# Patient Record
Sex: Female | Born: 1945
Health system: Southern US, Community
[De-identification: ages and names within clinical notes are randomized; demographics above are authoritative.]

## PROBLEM LIST (undated history)

## (undated) DIAGNOSIS — E039 Hypothyroidism, unspecified: Secondary | ICD-10-CM

## (undated) DIAGNOSIS — E119 Type 2 diabetes mellitus without complications: Secondary | ICD-10-CM

## (undated) DIAGNOSIS — I890 Lymphedema, not elsewhere classified: Secondary | ICD-10-CM

## (undated) DIAGNOSIS — I10 Essential (primary) hypertension: Secondary | ICD-10-CM

## (undated) DIAGNOSIS — Z9221 Personal history of antineoplastic chemotherapy: Secondary | ICD-10-CM

## (undated) DIAGNOSIS — Z923 Personal history of irradiation: Secondary | ICD-10-CM

## (undated) DIAGNOSIS — C50919 Malignant neoplasm of unspecified site of unspecified female breast: Secondary | ICD-10-CM

## (undated) HISTORY — PX: BREAST BIOPSY: SHX20

## (undated) HISTORY — PX: APPENDECTOMY: SHX54

## (undated) HISTORY — PX: TUBAL LIGATION: SHX77

---

## 1998-03-25 DIAGNOSIS — C50919 Malignant neoplasm of unspecified site of unspecified female breast: Secondary | ICD-10-CM

## 1998-03-25 HISTORY — PX: BREAST LUMPECTOMY: SHX2

## 1998-03-25 HISTORY — DX: Malignant neoplasm of unspecified site of unspecified female breast: C50.919

## 1998-03-25 HISTORY — PX: BREAST EXCISIONAL BIOPSY: SUR124

## 2004-03-15 ENCOUNTER — Ambulatory Visit: Payer: Self-pay | Admitting: Unknown Physician Specialty

## 2005-04-30 ENCOUNTER — Ambulatory Visit: Payer: Self-pay | Admitting: Unknown Physician Specialty

## 2005-10-11 ENCOUNTER — Ambulatory Visit: Payer: Self-pay | Admitting: Family Medicine

## 2005-11-28 ENCOUNTER — Ambulatory Visit: Payer: Self-pay | Admitting: Family Medicine

## 2006-05-10 ENCOUNTER — Emergency Department: Payer: Self-pay | Admitting: Emergency Medicine

## 2006-05-12 ENCOUNTER — Emergency Department: Payer: Self-pay | Admitting: Emergency Medicine

## 2006-06-10 ENCOUNTER — Ambulatory Visit: Payer: Self-pay | Admitting: Unknown Physician Specialty

## 2007-02-21 ENCOUNTER — Emergency Department: Payer: Self-pay | Admitting: Emergency Medicine

## 2007-07-22 ENCOUNTER — Ambulatory Visit: Payer: Self-pay | Admitting: Unknown Physician Specialty

## 2008-09-06 ENCOUNTER — Ambulatory Visit: Payer: Self-pay | Admitting: Family Medicine

## 2009-10-23 ENCOUNTER — Ambulatory Visit: Payer: Self-pay | Admitting: Family Medicine

## 2010-11-02 ENCOUNTER — Ambulatory Visit: Payer: Self-pay | Admitting: Family Medicine

## 2011-11-12 ENCOUNTER — Ambulatory Visit: Payer: Self-pay | Admitting: Nurse Practitioner

## 2012-11-11 ENCOUNTER — Ambulatory Visit: Payer: Self-pay | Admitting: Nurse Practitioner

## 2013-11-30 ENCOUNTER — Ambulatory Visit: Payer: Self-pay | Admitting: Nurse Practitioner

## 2014-04-22 ENCOUNTER — Ambulatory Visit: Payer: Self-pay | Admitting: Nurse Practitioner

## 2014-04-22 DIAGNOSIS — E119 Type 2 diabetes mellitus without complications: Secondary | ICD-10-CM | POA: Diagnosis not present

## 2014-04-22 DIAGNOSIS — R1031 Right lower quadrant pain: Secondary | ICD-10-CM | POA: Diagnosis not present

## 2014-04-22 DIAGNOSIS — I10 Essential (primary) hypertension: Secondary | ICD-10-CM | POA: Diagnosis not present

## 2014-04-22 DIAGNOSIS — K3580 Unspecified acute appendicitis: Secondary | ICD-10-CM | POA: Diagnosis not present

## 2014-04-22 LAB — COMPREHENSIVE METABOLIC PANEL
ALBUMIN: 3.1 g/dL — AB (ref 3.4–5.0)
ALK PHOS: 66 U/L (ref 46–116)
ALT: 23 U/L (ref 14–63)
ALT: 17 U/L (ref 14–63)
ANION GAP: 7 (ref 7–16)
AST: 20 U/L (ref 15–37)
Albumin: 3.4 g/dL (ref 3.4–5.0)
Alkaline Phosphatase: 68 U/L (ref 46–116)
Anion Gap: 7 (ref 7–16)
BUN: 9 mg/dL (ref 7–18)
BUN: 8 mg/dL (ref 7–18)
Bilirubin,Total: 1.4 mg/dL — ABNORMAL HIGH (ref 0.2–1.0)
Bilirubin,Total: 1.4 mg/dL — ABNORMAL HIGH (ref 0.2–1.0)
CHLORIDE: 96 mmol/L — AB (ref 98–107)
CO2: 30 mmol/L (ref 21–32)
CO2: 26 mmol/L (ref 21–32)
CREATININE: 0.8 mg/dL (ref 0.60–1.30)
Calcium, Total: 8.7 mg/dL (ref 8.5–10.1)
Calcium, Total: 8.3 mg/dL — ABNORMAL LOW (ref 8.5–10.1)
Chloride: 98 mmol/L (ref 98–107)
Creatinine: 1 mg/dL (ref 0.60–1.30)
EGFR (African American): >60
GFR CALC NON AF AMER: 59 — AB
Glucose: 128 mg/dL — ABNORMAL HIGH (ref 65–99)
Glucose: 161 mg/dL — ABNORMAL HIGH (ref 65–99)
Osmolality: 267 (ref 275–301)
Osmolality: 264 (ref 275–301)
POTASSIUM: 3.5 mmol/L (ref 3.5–5.1)
Potassium: 2.9 mmol/L — ABNORMAL LOW (ref 3.5–5.1)
SGOT(AST): 24 U/L (ref 15–37)
SODIUM: 131 mmol/L — AB (ref 136–145)
Sodium: 133 mmol/L — ABNORMAL LOW (ref 136–145)
Total Protein: 7.5 g/dL (ref 6.4–8.2)
Total Protein: 6.9 g/dL (ref 6.4–8.2)

## 2014-04-22 LAB — URINALYSIS, COMPLETE
BACTERIA: NONE SEEN
BILIRUBIN, UR: NEGATIVE
GLUCOSE, UR: NEGATIVE mg/dL (ref 0–75)
KETONE: NEGATIVE
Nitrite: NEGATIVE
Ph: 6 (ref 4.5–8.0)
Protein: 30
RBC,UR: 5 /HPF (ref 0–5)
SPECIFIC GRAVITY: 1.005 (ref 1.003–1.030)

## 2014-04-22 LAB — CBC
HCT: 39.5 % (ref 35.0–47.0)
HGB: 13 g/dL (ref 12.0–16.0)
MCH: 28.5 pg (ref 26.0–34.0)
MCHC: 32.8 g/dL (ref 32.0–36.0)
MCV: 87 fL (ref 80–100)
Platelet: 250 10*3/uL (ref 150–440)
RBC: 4.55 10*6/uL (ref 3.80–5.20)
RDW: 13.5 % (ref 11.5–14.5)
WBC: 16.7 10*3/uL — ABNORMAL HIGH (ref 3.6–11.0)

## 2014-04-22 LAB — PROTIME-INR
INR: 1.3
PROTHROMBIN TIME: 15.9 s — AB (ref 11.5–14.7)

## 2014-04-22 LAB — APTT: Activated PTT: 27.3 secs (ref 23.6–35.9)

## 2014-04-22 LAB — LIPASE, BLOOD: Lipase: 63 U/L — ABNORMAL LOW (ref 73–393)

## 2014-04-23 ENCOUNTER — Inpatient Hospital Stay: Payer: Self-pay | Admitting: Surgery

## 2014-04-23 DIAGNOSIS — R1031 Right lower quadrant pain: Secondary | ICD-10-CM | POA: Diagnosis not present

## 2014-04-23 DIAGNOSIS — Z923 Personal history of irradiation: Secondary | ICD-10-CM | POA: Diagnosis not present

## 2014-04-23 DIAGNOSIS — K3580 Unspecified acute appendicitis: Secondary | ICD-10-CM | POA: Diagnosis not present

## 2014-04-23 DIAGNOSIS — Z882 Allergy status to sulfonamides status: Secondary | ICD-10-CM | POA: Diagnosis not present

## 2014-04-23 DIAGNOSIS — E119 Type 2 diabetes mellitus without complications: Secondary | ICD-10-CM | POA: Diagnosis not present

## 2014-04-23 DIAGNOSIS — K352 Acute appendicitis with generalized peritonitis: Secondary | ICD-10-CM | POA: Diagnosis not present

## 2014-04-23 DIAGNOSIS — Z9851 Tubal ligation status: Secondary | ICD-10-CM | POA: Diagnosis not present

## 2014-04-23 DIAGNOSIS — K353 Acute appendicitis with localized peritonitis: Secondary | ICD-10-CM | POA: Diagnosis not present

## 2014-04-23 DIAGNOSIS — I10 Essential (primary) hypertension: Secondary | ICD-10-CM | POA: Diagnosis not present

## 2014-04-23 DIAGNOSIS — Z853 Personal history of malignant neoplasm of breast: Secondary | ICD-10-CM | POA: Diagnosis not present

## 2014-04-23 DIAGNOSIS — Z09 Encounter for follow-up examination after completed treatment for conditions other than malignant neoplasm: Secondary | ICD-10-CM | POA: Diagnosis not present

## 2014-04-23 DIAGNOSIS — Z9221 Personal history of antineoplastic chemotherapy: Secondary | ICD-10-CM | POA: Diagnosis not present

## 2014-04-24 LAB — CBC WITH DIFFERENTIAL/PLATELET
Basophil #: 0 10*3/uL (ref 0.0–0.1)
Basophil %: 0.1 %
Eosinophil #: 0 10*3/uL (ref 0.0–0.7)
Eosinophil %: 0.1 %
HCT: 33.2 % — ABNORMAL LOW (ref 35.0–47.0)
HGB: 11.1 g/dL — ABNORMAL LOW (ref 12.0–16.0)
LYMPHS PCT: 6.8 %
Lymphocyte #: 1.1 10*3/uL (ref 1.0–3.6)
MCH: 29 pg (ref 26.0–34.0)
MCHC: 33.4 g/dL (ref 32.0–36.0)
MCV: 87 fL (ref 80–100)
MONOS PCT: 8.8 %
Monocyte #: 1.4 x10 3/mm — ABNORMAL HIGH (ref 0.2–0.9)
NEUTROS ABS: 13.4 10*3/uL — AB (ref 1.4–6.5)
Neutrophil %: 84.2 %
Platelet: 241 10*3/uL (ref 150–440)
RBC: 3.82 10*6/uL (ref 3.80–5.20)
RDW: 13.8 % (ref 11.5–14.5)
WBC: 15.9 10*3/uL — AB (ref 3.6–11.0)

## 2014-04-24 LAB — BASIC METABOLIC PANEL
Anion Gap: 6 — ABNORMAL LOW (ref 7–16)
BUN: 11 mg/dL (ref 7–18)
CREATININE: 0.76 mg/dL (ref 0.60–1.30)
Calcium, Total: 8.7 mg/dL (ref 8.5–10.1)
Chloride: 105 mmol/L (ref 98–107)
Co2: 29 mmol/L (ref 21–32)
EGFR (Non-African Amer.): >60
GLUCOSE: 139 mg/dL — AB (ref 65–99)
Osmolality: 281 (ref 275–301)
POTASSIUM: 3.4 mmol/L — AB (ref 3.5–5.1)
SODIUM: 140 mmol/L (ref 136–145)

## 2014-07-18 LAB — SURGICAL PATHOLOGY

## 2014-07-24 NOTE — Discharge Summary (Signed)
PATIENT NAME:  Carmen Hamilton, Carmen Hamilton MR#:  [id number] DATE OF BIRTH:  1946/01/14  DATE OF ADMISSION:  04/23/2014 DATE OF DISCHARGE:  04/25/2014  FINAL DIAGNOSIS: Acute perforated appendicitis.   PRINCIPLE PROCEDURES:  1. CT scan of the abdomen and pelvis.  2. Laparoscopic appendectomy.   HOSPITAL COURSE SUMMARY: The patient underwent a laparoscopic appendectomy. She was treated with intravenous antibiotics. On postoperative day #2, she was feeling well. By postoperative day #3, the patient was doing much better, was stable and improved for discharge, with follow-up in the office. She was tolerating a regular diet.   DISCHARGE MEDICATIONS: Will include: Augmentin 500 mg 1 tab every 12 hours for 1 more week. Medication reconciliation form was performed    ____________________________ Jeannette How. Marina Gravel, MD mab:mw D: 05/11/2014 12:43:25 ET T: 05/11/2014 13:25:17 ET JOB#: [id number]  cc: Elta Guadeloupe A. Marina Gravel, MD, <Dictator> Gerilynn Mccullars A Dequon Schnebly MD ELECTRONICALLY SIGNED 05/12/2014 10:06

## 2014-07-24 NOTE — H&P (Signed)
PATIENT NAME:  Carmen Hamilton, Carmen Hamilton MR#:  [id number] DATE OF BIRTH:  [birth year]  DATE OF ADMISSION:  04/22/2014  CHIEF COMPLAINT: Right lower quadrant pain.   HISTORY OF PRESENT ILLNESS: This is a patient with 1 day of abdominal pain that started diffusely and is now in the right lower quadrant. She denies any back pain. She has never had an episode like this before. She states that she had a fever to 100.7, earlier this morning, but no fever since. She has been nauseated but has not vomited. She denies melena or hematochezia, dysuria or hematemesis.   PAST MEDICAL HISTORY: Hypertension, diabetes for which she is not medicated and left breast cancer.   PAST SURGICAL HISTORY: Left breast conservation therapy with radiation and chemotherapy, 23 lymph nodes removed, 16 years ago; she is a long term survivor; she has also had a tubal ligation via semi-open technique periumbilically. No other abdominal surgeries. Of note, she does have some lymphedema of the left arm.   ALLERGIES: SULFA.   MEDICATIONS: See reconciliation.   FAMILY HISTORY: No history of appendicitis. No history of breast cancer.   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   REVIEW OF SYSTEMS: A complete system review is performed and negative with the exception of that mentioned in the HPI.   PHYSICAL EXAMINATION: GENERAL: Healthy, obese, female patient; she appears comfortable and in no acute distress.  VITAL SIGNS: No vital signs are available in the emergency yet.  HEENT: No scleral icterus.  NECK: No palpable neck nodes.  CHEST: Clear to auscultation.  CARDIAC: Regular rate and rhythm.  ABDOMEN: Showing a periumbilical infraumbilical scar without hernia. The abdomen is otherwise soft. There is minimal guarding in the right lower quadrant, minimal percussion tenderness in the right lower quadrant; tenderness to deep palpation in the right lower quadrant.  EXTREMITIES: Minimal edema of the  lower extremities, fairly marked edema of the left upper extremity, where an IV has been placed.   DIAGNOSTIC DATA:  CT scan shows appendicitis in a retrocecal position. Laboratory values are now available showing white blood cell count 16.7, an H and H of 13 and 39; and a platelet count of 250,000, potassium is 2.9, sodium of 133, chloride of 96, bilirubin 1.4, and a glucose of 128, lipase of 63.   ASSESSMENT AND PLAN: This is a patient with and physical examination findings consistent with a history of appendicitis. CT confirms and she has a leukocytosis. I have recommended admission to the hospital and laparoscopic appendectomy this evening. I informed her that it would be performed by Dr. Marina Gravel. I discussed the options, and the rationale, and the risks of bleeding, infection, negative laparoscopy, conversion to an open procedure, and bowel injury as well as the risks of untreated ruptured appendix. She understood and agreed to proceed. Her family was present as well.    ____________________________ Jerrol Banana. Burt Knack, MD rec:nt D: 04/22/2014 17:46:45 ET T: 04/22/2014 19:04:07 ET JOB#: [id number]  cc: Jerrol Banana. Burt Knack, MD, <Dictator> Florene Glen MD ELECTRONICALLY SIGNED 04/23/2014 14:41

## 2014-07-24 NOTE — H&P (Signed)
Subjective/Chief Complaint RLQ pain   History of Present Illness one day RLQ pain, no back pain, no prior episode had fever to 11.7 no dysuria no melena min nausea   Past History PMH DM, no meds, HTN, left breast cancer no CAD, CVA PSH tubal lig, left breast cons therapy   Past Medical Health Hypertension, Diabetes Mellitus   Past Med/Surgical Hx:  HTN:   Cancer, Breast:   Lymph Node Dissection:   ALLERGIES:  Sulfa: Unknown  HOME MEDICATIONS: Medication Instructions Status  Avandia 8 mg oral tablet 1  orally   Active  hydrochlorothiazide-triamterene 25 mg-37.5 mg oral tablet 1  orally once a day  Active   Family and Social History:  Family History Non-Contributory   Social History negative tobacco, negative ETOH, retired Public house manager of [employer]   Review of Systems:  Fever/Chills Yes   Cough No   Abdominal Pain Yes   Diarrhea No   Constipation No   Nausea/Vomiting No   SOB/DOE No   Chest Pain No   Dysuria No   Tolerating Diet Nauseated   Medications/Allergies Reviewed Medications/Allergies reviewed   Physical Exam:  GEN no acute distress, obese   HEENT pink conjunctivae   NECK supple   RESP normal resp effort  clear BS  no use of accessory muscles   CARD regular rate   ABD positive tenderness  no hernia  scar, tender RLQ, min perc tenderness, no CVA tenderness   LYMPH negative neck   EXTR positive edema   SKIN left arm lymphedema   PSYCH alert, A+O to time, place, person, good insight   Lab Results: Hepatic:  29-Jan-16 17:12   Albumin, Serum 3.4  Routine Chem:  29-Jan-16 17:12   Lipase  63 (Result(s) reported on 22 Apr 2014 at 05:37PM.)  Glucose, Serum  128  BUN 9  Creatinine (comp) 1.00  Sodium, Serum  133  Potassium, Serum  2.9  Chloride, Serum  96  CO2, Serum 30  Calcium (Total), Serum 8.7  Osmolality (calc) 267  eGFR (African American) >60  eGFR (Non-African American)  59 (eGFR values <63m/min/1.73 m2 may be an  indication of chronic kidney disease (CKD). Calculated eGFR, using the MRDR Study equation, is useful in  patients with stable renal function. The eGFR calculation will not be reliable in acutely ill patients when serum creatinine is changing rapidly. It is not useful in patients on dialysis. The eGFR calculation may not be applicable to patients at the low and high extremes of body sizes, pregnant women, and vegetarians.)  Anion Gap 7  Routine Coag:  29-Jan-16 17:12   Prothrombin  15.9  INR 1.3 (INR reference interval applies to patients on anticoagulant therapy. A single INR therapeutic range for coumarins is not optimal for all indications; however, the suggested range for most indications is 2.0 - 3.0. Exceptions to the INR Reference Range may include: Prosthetic heart valves, acute myocardial infarction, prevention of myocardial infarction, and combinations of aspirin and anticoagulant. The need for a higher or lower target INR must be assessed individually. Reference: The Pharmacology and Management of the Vitamin K  antagonists: the seventh ACCP Conference on Antithrombotic and Thrombolytic Therapy. CAQTMA.2633Sept:126 (3suppl): [id number] A HCT value >55% may artifactually increase the PT.  In one study,  the increase was an average of 25%. Reference:  "Effect on Routine and Special Coagulation Testing Values of Citrate Anticoagulant Adjustment in Patients with High HCT Values." American Journal of Clinical Pathology 2006;126:400-405.)  Activated  PTT (APTT) 27.3 (A HCT value >55% may artifactually increase the APTT. In one study, the increase was an average of 19%. Reference: "Effect on Routine and Special Coagulation Testing Values of Citrate Anticoagulant Adjustment in Patients with High HCT Values." American Journal of Clinical Pathology 2006;126:400-405.)   Radiology Results: CT:    29-Jan-16 15:43, CT Abdomen and Pelvis With Contrast  CT Abdomen and Pelvis With  Contrast  REASON FOR EXAM:    labs 1st Call Report  [id number]  R lower quad pain   fever   eval for appendi...  COMMENTS:       PROCEDURE: CT  - CT ABDOMEN / PELVIS  W  - Apr 22 2014  3:43PM     CLINICAL DATA:  Acute right lower quadrant pain for 2 days.    EXAM:  CT ABDOMEN AND PELVIS WITH CONTRAST    TECHNIQUE:  Multidetector CT imaging of the abdomen and pelvis was performed  using the standard protocol following bolus administration of  intravenous contrast.  CONTRAST:  100 mL of Omnipaque 300 intravenously.    COMPARISON:  None.    FINDINGS:  Severe multilevel degenerative disc disease is noted in the lumbar  spine. Visualized lung bases appear normal.    Mild sliding-type hiatal hernia is noted. No gallstones are noted.  Benign cyst is noted in left hepatic lobe. The spleen and pancreas  appear normal. Adrenal glands and kidneys appear normal. No  hydronephrosis or renal obstruction is noted. The appendix is  enlarged inflamed consistent with appendicitis. It is retrocecal in  position. No definite abscess is seen at this time. Uterus and  ovaries appear normal. Urinary bladder appears normal. No  significant adenopathy is noted.     IMPRESSION:  Findings consistent with acute appendicitis. The appendix is  retrocecal in position. No definite abscess is seen at this time.  Critical Value/emergent results were called by telephone at the time  of interpretation on 04/22/2014 at 3:53 pm to Dr. Gaetano Net , who  verbally acknowledged these results.      Electronically Signed    By: Sabino Dick M.D.    On: 04/22/2014 15:54       Verified By: Marveen Reeks, M.D.,    Assessment/Admission Diagnosis retrocecal appendicitis rec lap appy options rationale and risks in detail agrees with plan   Electronic Signatures: Florene Glen (MD)  (Signed 29-Jan-16 17:42)  Authored: CHIEF COMPLAINT and HISTORY, PAST MEDICAL/SURGIAL HISTORY, ALLERGIES, HOME MEDICATIONS,  FAMILY AND SOCIAL HISTORY, REVIEW OF SYSTEMS, PHYSICAL EXAM, LABS, Radiology, ASSESSMENT AND PLAN   Last Updated: 29-Jan-16 17:42 by Florene Glen (MD)

## 2014-07-24 NOTE — Op Note (Signed)
PATIENT NAME:  Carmen Hamilton, GODOWN MR#:  [id number] DATE OF BIRTH:  Sep 13, 1945  DATE OF PROCEDURE:  04/23/2014  PREOPERATIVE DIAGNOSIS: Acute appendicitis.   POSTOPERATIVE DIAGNOSIS: Acute retrocecal gangrenous and perforated appendicitis.   SURGEON: Nuh Lipton A. Marina Gravel, MD.   ASSISTANT: Scrub tech.  TYPE OF ANESTHESIA: General endotracheal.   FINDINGS: As above.   SPECIMENS: Appendix in 2 pieces.   ESTIMATED BLOOD LOSS: 50 mL.   DESCRIPTION OF PROCEDURE: With informed consent obtained from the patient, she was brought to the operating room and positioned supine. General oroendotracheal anesthesia was induced. The patient's abdomen was sterilely prepped and draped with ChloraPrep solution. Timeout was observed. Left arm was padded and tucked at her side. Perioperative antibiotics and DVT prophylaxis being administered.   An infraumbilical transversely oriented skin incision was fashioned with a scalpel and carried down with sharp dissection to the fascia. The fascia was incised in the midline and elevated towards with Kocher clamps. Peritoneum was entered sharply. Stay sutures were passed. Pneumoperitoneum was established through a 12 mm blunt Hassan trocar. A 12 mm blunt Hassan trocar was placed in the right upper quadrant. A 5 mm bladeless trocar was placed in the left lower quadrant.   The patient was then positioned airplane right side up and in steep trendelenburg. A small amount of purulent fluid was found in the cul-de-sac. The appendix was retrocecal in location and its tip pointing towards the right upper quadrant tip. Lateral attachments were taken down with Harmonic scalpel apparatus. The base of the appendix was identified at the confluence of the tinea. The appendix appeared to be necrotic in presentation. The appendix was then elevated towards the anterior abdominal wall by rotating the cecum medially. The base of the appendix was identified and dissected out. The mesoappendix was then  sequentially taken with the Harmonic scalpel apparatus. A blue loaded endoscopic GIA 35 mm stapler was then applied across the base of the appendix and the appendix was captured and retrieved.   Attached to the colon, more distally on the colon, was what appeared to be the tip of the appendix.   This was separately removed utilizing Harmonic scalpel dissection. Hemostasis being obtained with 5 mm clip applier and Surgicel. This second specimen was submitted separately. The right lower quadrant was irrigated with approximately 1 liter of normal saline and aspirated dry and hemostasis appeared to be excellent on the operative field. Ports were then removed under direct visualization. The infraumbilical fascial defect being reapproximated with an additional figure-of-eight #0 Vicryl suture for orientation with existing stay sutures being tied to each other. A total of 30 mL of 0.25% plain Marcaine was infiltrated along all skin and fascial incisions prior to closure and 4-0 Vicryl subcuticular was applied to all skin edges. Benzoin, Steri-Strips, Telfa and Tegaderm were then applied. The patient was then subsequently extubated and taken to the recovery room in stable and satisfactory condition.     ____________________________ Jeannette How Marina Gravel, MD mab:TT D: 04/23/2014 06:48:23 ET T: 04/23/2014 16:04:39 ET JOB#: [id number]  cc: Elta Guadeloupe A. Marina Gravel, MD, <Dictator> Mitali Shenefield Bettina Gavia MD ELECTRONICALLY SIGNED 04/24/2014 20:57

## 2014-11-22 ENCOUNTER — Other Ambulatory Visit: Payer: Self-pay | Admitting: Nurse Practitioner

## 2014-11-22 DIAGNOSIS — Z79899 Other long term (current) drug therapy: Secondary | ICD-10-CM | POA: Diagnosis not present

## 2014-11-22 DIAGNOSIS — I10 Essential (primary) hypertension: Secondary | ICD-10-CM | POA: Diagnosis not present

## 2014-11-22 DIAGNOSIS — E119 Type 2 diabetes mellitus without complications: Secondary | ICD-10-CM | POA: Diagnosis not present

## 2014-11-22 DIAGNOSIS — I890 Lymphedema, not elsewhere classified: Secondary | ICD-10-CM | POA: Diagnosis not present

## 2014-11-22 DIAGNOSIS — Z13220 Encounter for screening for lipoid disorders: Secondary | ICD-10-CM | POA: Diagnosis not present

## 2014-11-22 DIAGNOSIS — Z1231 Encounter for screening mammogram for malignant neoplasm of breast: Secondary | ICD-10-CM

## 2014-11-22 DIAGNOSIS — Z0000 Encounter for general adult medical examination without abnormal findings: Secondary | ICD-10-CM | POA: Diagnosis not present

## 2014-12-15 ENCOUNTER — Ambulatory Visit: Payer: Self-pay

## 2014-12-19 ENCOUNTER — Other Ambulatory Visit: Payer: Self-pay | Admitting: Nurse Practitioner

## 2014-12-19 ENCOUNTER — Ambulatory Visit
Admission: RE | Admit: 2014-12-19 | Discharge: 2014-12-19 | Disposition: A | Discharge status: Home / Self Care | Payer: Commercial Managed Care - HMO | Source: Ambulatory Visit | Attending: Nurse Practitioner | Admitting: Nurse Practitioner

## 2014-12-19 DIAGNOSIS — Z1231 Encounter for screening mammogram for malignant neoplasm of breast: Secondary | ICD-10-CM | POA: Diagnosis not present

## 2014-12-19 HISTORY — DX: Malignant neoplasm of unspecified site of unspecified female breast: C50.919

## 2015-01-10 DIAGNOSIS — S8002XA Contusion of left knee, initial encounter: Secondary | ICD-10-CM | POA: Diagnosis not present

## 2015-08-15 DIAGNOSIS — Z882 Allergy status to sulfonamides status: Secondary | ICD-10-CM | POA: Diagnosis not present

## 2015-08-15 DIAGNOSIS — G8911 Acute pain due to trauma: Secondary | ICD-10-CM | POA: Diagnosis not present

## 2015-08-15 DIAGNOSIS — Z7982 Long term (current) use of aspirin: Secondary | ICD-10-CM | POA: Diagnosis not present

## 2015-08-15 DIAGNOSIS — M1711 Unilateral primary osteoarthritis, right knee: Secondary | ICD-10-CM | POA: Diagnosis not present

## 2015-08-15 DIAGNOSIS — S8001XA Contusion of right knee, initial encounter: Secondary | ICD-10-CM | POA: Diagnosis not present

## 2015-08-15 DIAGNOSIS — Z79899 Other long term (current) drug therapy: Secondary | ICD-10-CM | POA: Diagnosis not present

## 2015-11-20 DIAGNOSIS — R05 Cough: Secondary | ICD-10-CM | POA: Diagnosis not present

## 2015-11-20 DIAGNOSIS — J00 Acute nasopharyngitis [common cold]: Secondary | ICD-10-CM | POA: Diagnosis not present

## 2015-12-08 DIAGNOSIS — Z79899 Other long term (current) drug therapy: Secondary | ICD-10-CM | POA: Diagnosis not present

## 2015-12-08 DIAGNOSIS — I10 Essential (primary) hypertension: Secondary | ICD-10-CM | POA: Diagnosis not present

## 2015-12-08 DIAGNOSIS — E669 Obesity, unspecified: Secondary | ICD-10-CM | POA: Diagnosis not present

## 2015-12-08 DIAGNOSIS — Z1159 Encounter for screening for other viral diseases: Secondary | ICD-10-CM | POA: Diagnosis not present

## 2015-12-08 DIAGNOSIS — Z1231 Encounter for screening mammogram for malignant neoplasm of breast: Secondary | ICD-10-CM | POA: Diagnosis not present

## 2015-12-08 DIAGNOSIS — Z0000 Encounter for general adult medical examination without abnormal findings: Secondary | ICD-10-CM | POA: Diagnosis not present

## 2015-12-08 DIAGNOSIS — Z23 Encounter for immunization: Secondary | ICD-10-CM | POA: Diagnosis not present

## 2015-12-08 DIAGNOSIS — E119 Type 2 diabetes mellitus without complications: Secondary | ICD-10-CM | POA: Diagnosis not present

## 2015-12-11 ENCOUNTER — Other Ambulatory Visit: Payer: Self-pay | Admitting: Nurse Practitioner

## 2015-12-11 DIAGNOSIS — Z1231 Encounter for screening mammogram for malignant neoplasm of breast: Secondary | ICD-10-CM

## 2016-01-01 ENCOUNTER — Ambulatory Visit
Admission: RE | Admit: 2016-01-01 | Discharge: 2016-01-01 | Disposition: A | Discharge status: Home / Self Care | Payer: Commercial Managed Care - HMO | Source: Ambulatory Visit | Attending: Nurse Practitioner | Admitting: Nurse Practitioner

## 2016-01-01 ENCOUNTER — Other Ambulatory Visit: Payer: Self-pay | Admitting: Nurse Practitioner

## 2016-01-01 DIAGNOSIS — Z1231 Encounter for screening mammogram for malignant neoplasm of breast: Secondary | ICD-10-CM | POA: Diagnosis not present

## 2016-08-29 IMAGING — CT CT ABD-PELV W/ CM
2 of 5 series · 16 of 46 positions shown, 18 images · IV contrast (omnipaque)
Comparison: None.

CLINICAL DATA: Acute right lower quadrant pain for 2 days.

EXAM:
CT ABDOMEN AND PELVIS WITH CONTRAST
TECHNIQUE: Multidetector CT imaging of the abdomen and pelvis was performed
using the standard protocol following bolus administration of
intravenous contrast.
CONTRAST:  100 mL of Omnipaque 300 intravenously.

[Series 2: routine abd pel with · axial · 0.75mm/px · z∈[-422,+12]mm · 13 of 97 slices shown, 15 images]
[im 5/97  soft-tissue]
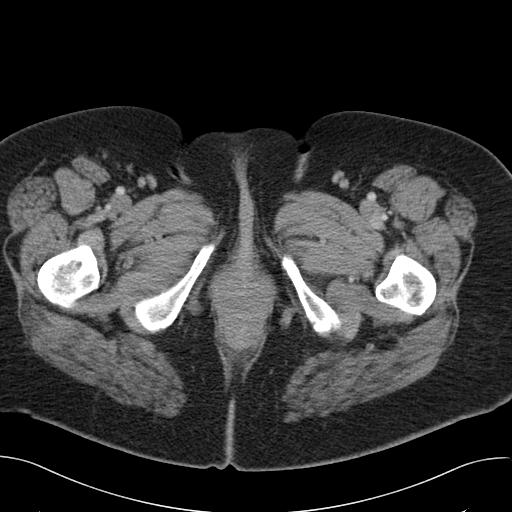
[im 5/97  bone]
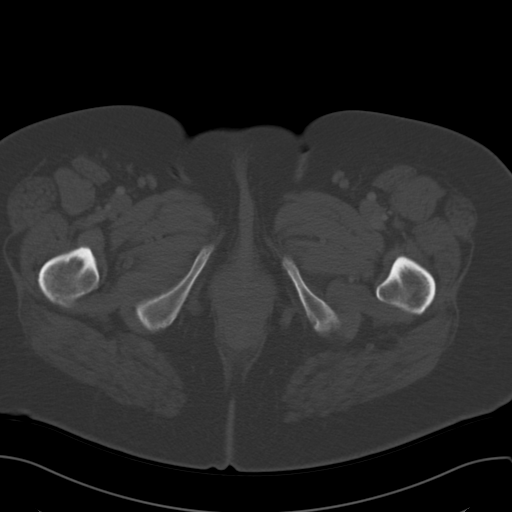
[im 15/97  soft-tissue]
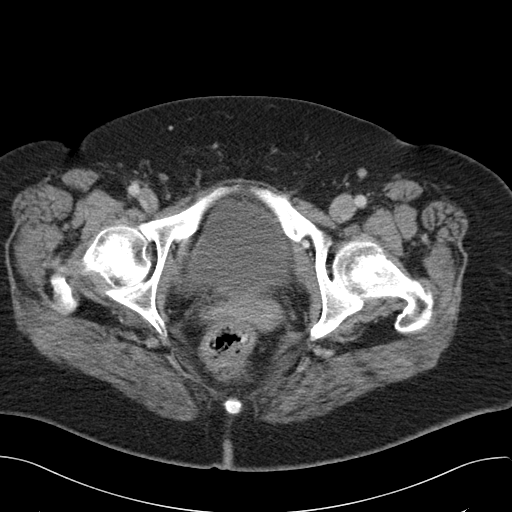
[im 20/97  soft-tissue]
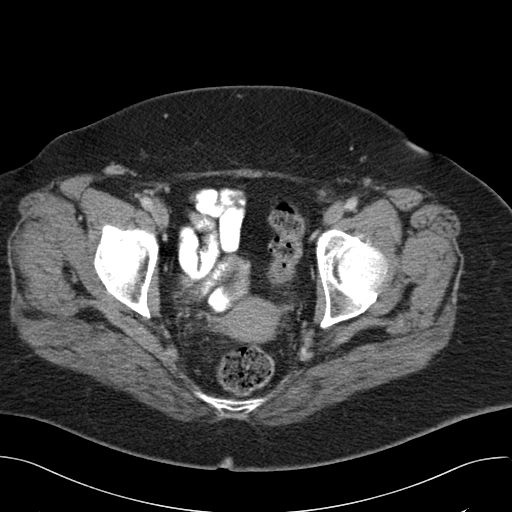
[im 29/97  soft-tissue]
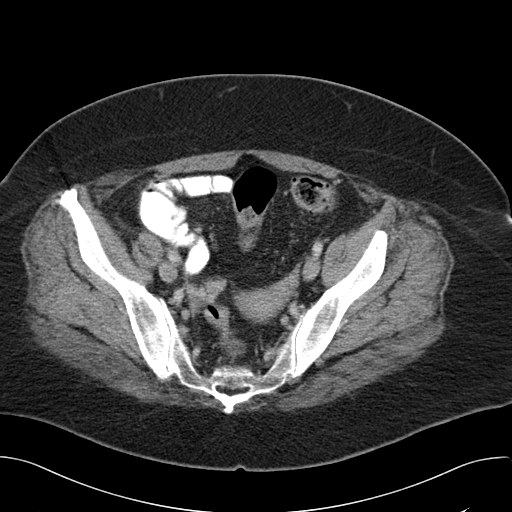
[im 34/97  soft-tissue]
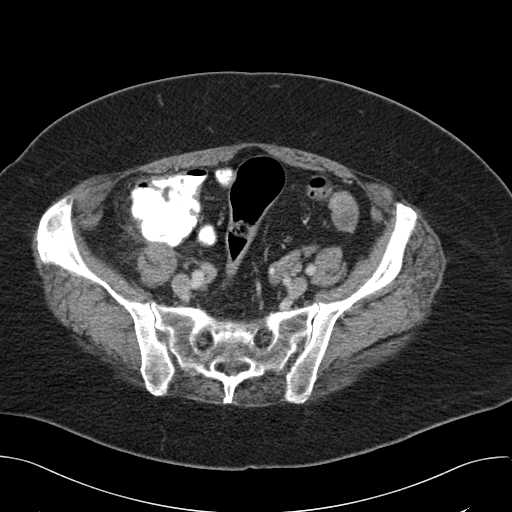
[im 44/97  soft-tissue]
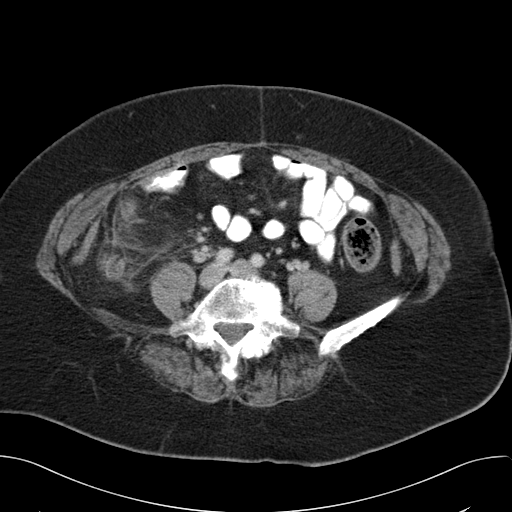
[im 49/97  soft-tissue]
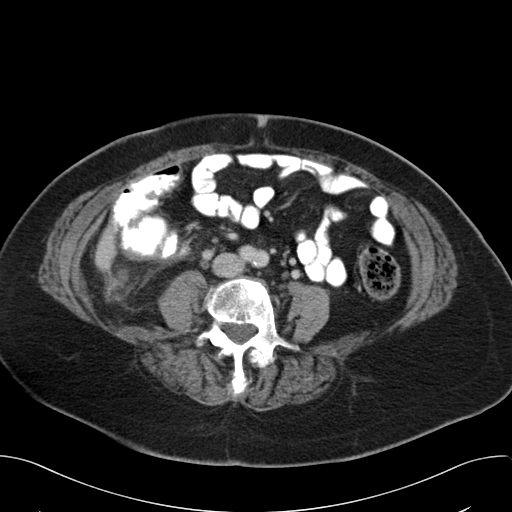
[im 53/97  soft-tissue]
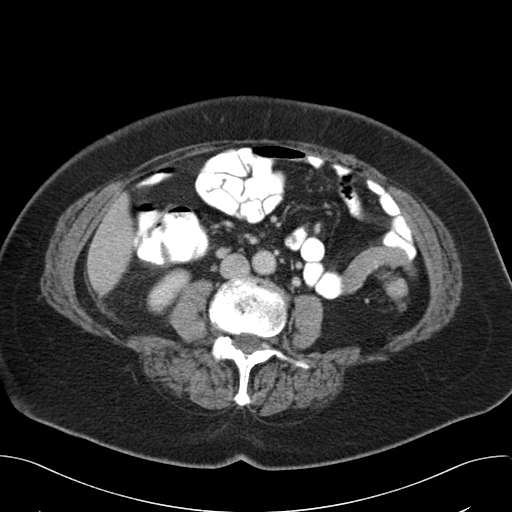
[im 63/97  soft-tissue]
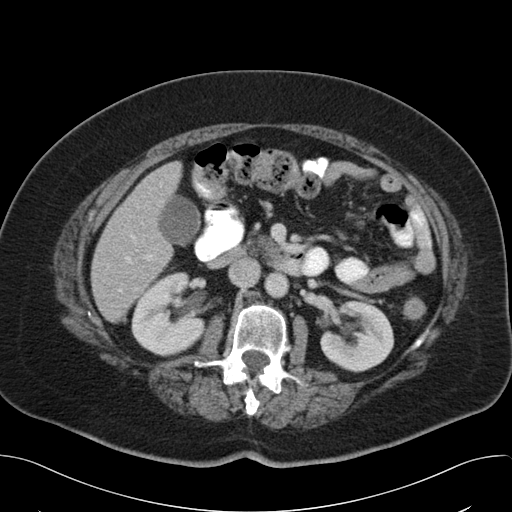
[im 63/97  bone]
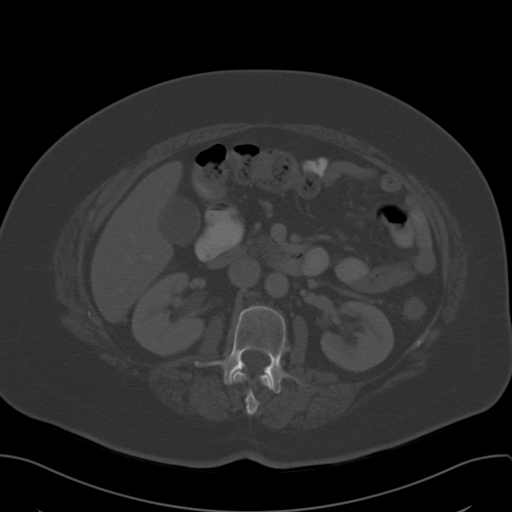
[im 68/97  soft-tissue]
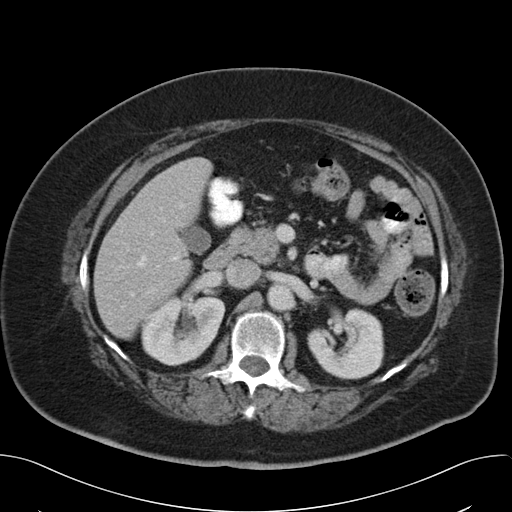
[im 77/97  soft-tissue]
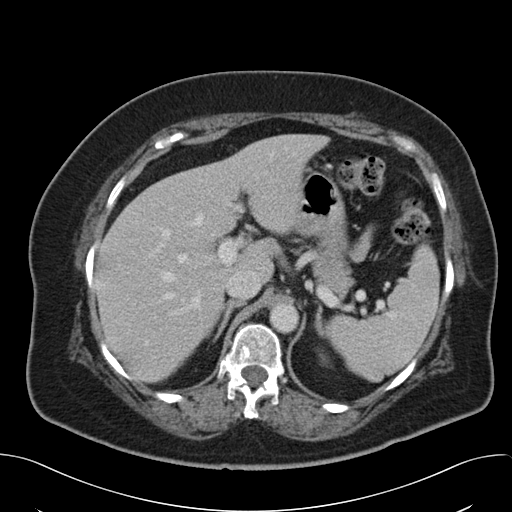
[im 82/97  soft-tissue]
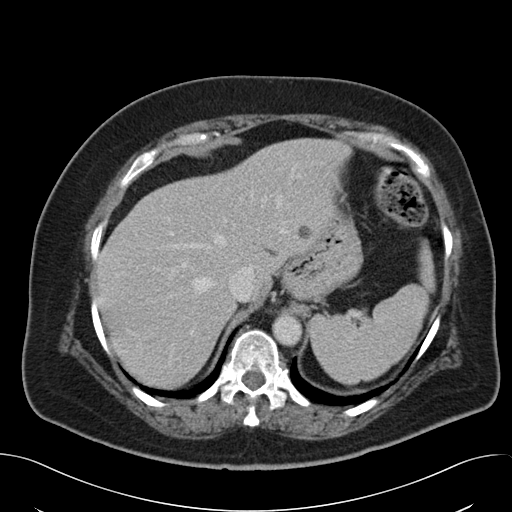
[im 92/97  soft-tissue]
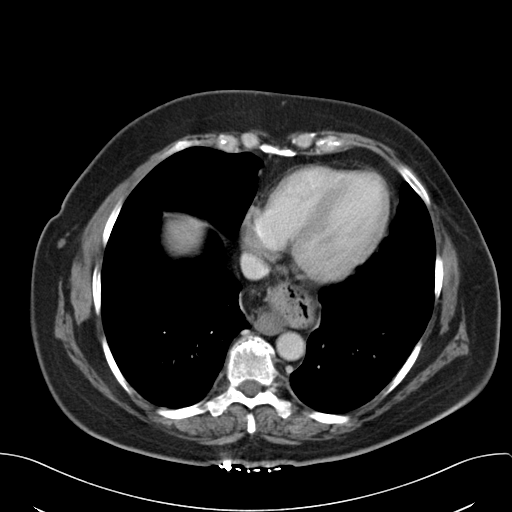

[Series 6: cor routine abd pel with · coronal · 0.80mm/px · 3 of 144 slices shown]
[im 48/144  soft-tissue]
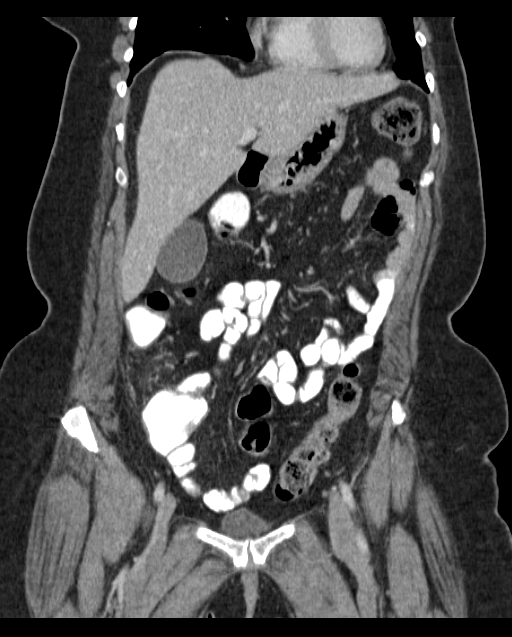
[im 64/144  soft-tissue]
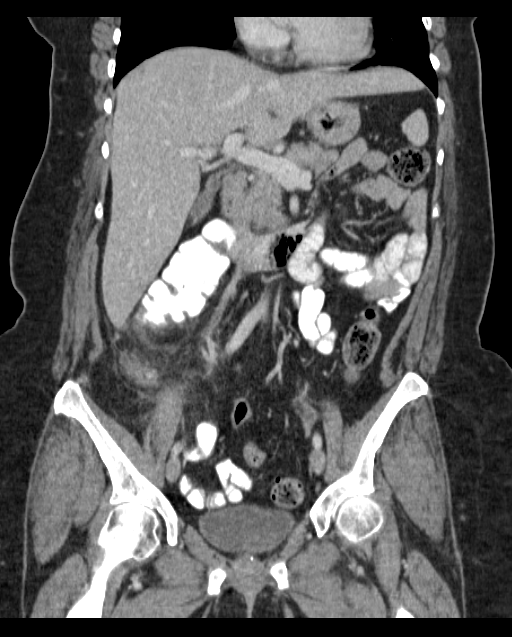
[im 80/144  soft-tissue]
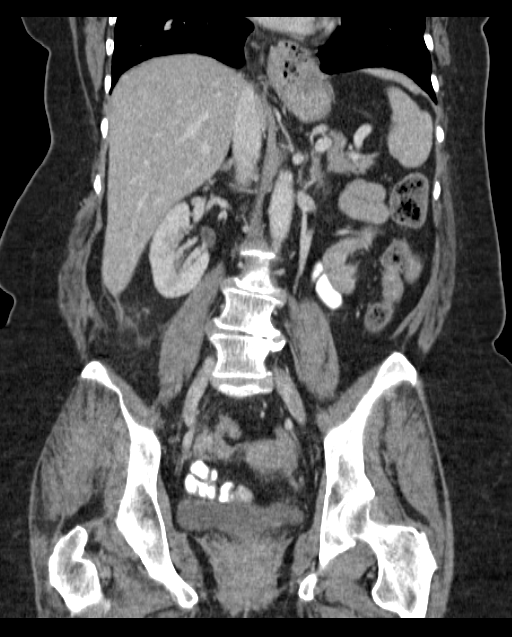

[16 of 46 positions shown; findings below may reference images not displayed]

FINDINGS: Severe multilevel degenerative disc disease is noted in the lumbar
spine. Visualized lung bases appear normal.

Mild sliding-type hiatal hernia is noted. No gallstones are noted.
Benign cyst is noted in left hepatic lobe. The spleen and pancreas
appear normal. Adrenal glands and kidneys appear normal. No
hydronephrosis or renal obstruction is noted. The appendix is
enlarged inflamed consistent with appendicitis. It is retrocecal in
position. No definite abscess is seen at this time. Uterus and
ovaries appear normal. Urinary bladder appears normal. No
significant adenopathy is noted.
IMPRESSION: Findings consistent with acute appendicitis. The appendix is
retrocecal in position. No definite abscess is seen at this time.
Critical Value/emergent results were called by telephone at the time
of interpretation on 04/22/2014 at [DATE] to Dr. KAMATCHO BEDIOUI , who
verbally acknowledged these results.

## 2016-11-17 DIAGNOSIS — E119 Type 2 diabetes mellitus without complications: Secondary | ICD-10-CM | POA: Diagnosis not present

## 2016-11-17 DIAGNOSIS — Z79899 Other long term (current) drug therapy: Secondary | ICD-10-CM | POA: Diagnosis not present

## 2016-11-17 DIAGNOSIS — I890 Lymphedema, not elsewhere classified: Secondary | ICD-10-CM | POA: Diagnosis not present

## 2016-11-17 DIAGNOSIS — L03114 Cellulitis of left upper limb: Secondary | ICD-10-CM | POA: Diagnosis not present

## 2016-11-17 DIAGNOSIS — Z882 Allergy status to sulfonamides status: Secondary | ICD-10-CM | POA: Diagnosis not present

## 2016-11-17 DIAGNOSIS — Z9049 Acquired absence of other specified parts of digestive tract: Secondary | ICD-10-CM | POA: Diagnosis not present

## 2016-11-17 DIAGNOSIS — Z853 Personal history of malignant neoplasm of breast: Secondary | ICD-10-CM | POA: Diagnosis not present

## 2016-11-17 DIAGNOSIS — Z7982 Long term (current) use of aspirin: Secondary | ICD-10-CM | POA: Diagnosis not present

## 2016-11-17 DIAGNOSIS — I10 Essential (primary) hypertension: Secondary | ICD-10-CM | POA: Diagnosis not present

## 2016-11-21 ENCOUNTER — Other Ambulatory Visit: Payer: Self-pay | Admitting: Nurse Practitioner

## 2016-11-21 DIAGNOSIS — Z1231 Encounter for screening mammogram for malignant neoplasm of breast: Secondary | ICD-10-CM

## 2016-12-30 DIAGNOSIS — H524 Presbyopia: Secondary | ICD-10-CM | POA: Diagnosis not present

## 2016-12-31 DIAGNOSIS — I10 Essential (primary) hypertension: Secondary | ICD-10-CM | POA: Diagnosis not present

## 2016-12-31 DIAGNOSIS — E669 Obesity, unspecified: Secondary | ICD-10-CM | POA: Diagnosis not present

## 2016-12-31 DIAGNOSIS — Z79899 Other long term (current) drug therapy: Secondary | ICD-10-CM | POA: Diagnosis not present

## 2016-12-31 DIAGNOSIS — E119 Type 2 diabetes mellitus without complications: Secondary | ICD-10-CM | POA: Diagnosis not present

## 2016-12-31 DIAGNOSIS — Z0000 Encounter for general adult medical examination without abnormal findings: Secondary | ICD-10-CM | POA: Diagnosis not present

## 2016-12-31 DIAGNOSIS — Z23 Encounter for immunization: Secondary | ICD-10-CM | POA: Diagnosis not present

## 2017-01-13 ENCOUNTER — Ambulatory Visit
Admission: RE | Admit: 2017-01-13 | Discharge: 2017-01-13 | Disposition: A | Discharge status: Home / Self Care | Payer: Medicare HMO | Source: Ambulatory Visit | Attending: Nurse Practitioner | Admitting: Nurse Practitioner

## 2017-01-13 DIAGNOSIS — Z1231 Encounter for screening mammogram for malignant neoplasm of breast: Secondary | ICD-10-CM | POA: Insufficient documentation

## 2017-01-13 HISTORY — DX: Personal history of irradiation: Z92.3

## 2017-01-13 HISTORY — DX: Personal history of antineoplastic chemotherapy: Z92.21

## 2017-01-23 DIAGNOSIS — H40003 Preglaucoma, unspecified, bilateral: Secondary | ICD-10-CM | POA: Diagnosis not present

## 2017-06-11 DIAGNOSIS — Z1211 Encounter for screening for malignant neoplasm of colon: Secondary | ICD-10-CM | POA: Diagnosis not present

## 2017-06-23 DIAGNOSIS — H40003 Preglaucoma, unspecified, bilateral: Secondary | ICD-10-CM | POA: Diagnosis not present

## 2017-06-23 DIAGNOSIS — H25813 Combined forms of age-related cataract, bilateral: Secondary | ICD-10-CM | POA: Diagnosis not present

## 2017-06-23 DIAGNOSIS — H353131 Nonexudative age-related macular degeneration, bilateral, early dry stage: Secondary | ICD-10-CM | POA: Diagnosis not present

## 2017-07-01 DIAGNOSIS — E119 Type 2 diabetes mellitus without complications: Secondary | ICD-10-CM | POA: Diagnosis not present

## 2017-07-01 DIAGNOSIS — E669 Obesity, unspecified: Secondary | ICD-10-CM | POA: Diagnosis not present

## 2017-07-01 DIAGNOSIS — Z79899 Other long term (current) drug therapy: Secondary | ICD-10-CM | POA: Diagnosis not present

## 2017-07-01 DIAGNOSIS — I10 Essential (primary) hypertension: Secondary | ICD-10-CM | POA: Diagnosis not present

## 2017-08-13 DIAGNOSIS — H2513 Age-related nuclear cataract, bilateral: Secondary | ICD-10-CM | POA: Diagnosis not present

## 2017-10-01 DIAGNOSIS — E1136 Type 2 diabetes mellitus with diabetic cataract: Secondary | ICD-10-CM | POA: Diagnosis not present

## 2017-10-01 DIAGNOSIS — Z79899 Other long term (current) drug therapy: Secondary | ICD-10-CM | POA: Diagnosis not present

## 2017-10-01 DIAGNOSIS — H52203 Unspecified astigmatism, bilateral: Secondary | ICD-10-CM | POA: Diagnosis not present

## 2017-10-01 DIAGNOSIS — Z7982 Long term (current) use of aspirin: Secondary | ICD-10-CM | POA: Diagnosis not present

## 2017-10-01 DIAGNOSIS — H02833 Dermatochalasis of right eye, unspecified eyelid: Secondary | ICD-10-CM | POA: Diagnosis not present

## 2017-10-01 DIAGNOSIS — H25811 Combined forms of age-related cataract, right eye: Secondary | ICD-10-CM | POA: Diagnosis not present

## 2017-10-01 DIAGNOSIS — H53002 Unspecified amblyopia, left eye: Secondary | ICD-10-CM | POA: Diagnosis not present

## 2017-10-01 DIAGNOSIS — H02836 Dermatochalasis of left eye, unspecified eyelid: Secondary | ICD-10-CM | POA: Diagnosis not present

## 2017-10-01 DIAGNOSIS — Z853 Personal history of malignant neoplasm of breast: Secondary | ICD-10-CM | POA: Diagnosis not present

## 2017-10-01 DIAGNOSIS — H2513 Age-related nuclear cataract, bilateral: Secondary | ICD-10-CM | POA: Diagnosis not present

## 2017-10-01 DIAGNOSIS — H2512 Age-related nuclear cataract, left eye: Secondary | ICD-10-CM | POA: Diagnosis not present

## 2017-10-17 ENCOUNTER — Encounter: Payer: Self-pay | Admitting: *Deleted

## 2017-10-20 ENCOUNTER — Ambulatory Visit
Admission: RE | Admit: 2017-10-20 | Discharge: 2017-10-20 | Disposition: A | Discharge status: Home / Self Care | Payer: Medicare HMO | Source: Ambulatory Visit | Attending: Gastroenterology | Admitting: Gastroenterology

## 2017-10-20 ENCOUNTER — Ambulatory Visit: Payer: Medicare HMO | Admitting: Certified Registered Nurse Anesthetist

## 2017-10-20 ENCOUNTER — Encounter: Payer: Self-pay | Admitting: Anesthesiology

## 2017-10-20 ENCOUNTER — Encounter
Admission: RE | Disposition: A | Discharge status: Home / Self Care | Payer: Self-pay | Source: Ambulatory Visit | Attending: Gastroenterology

## 2017-10-20 DIAGNOSIS — Z923 Personal history of irradiation: Secondary | ICD-10-CM | POA: Insufficient documentation

## 2017-10-20 DIAGNOSIS — Z853 Personal history of malignant neoplasm of breast: Secondary | ICD-10-CM | POA: Insufficient documentation

## 2017-10-20 DIAGNOSIS — E039 Hypothyroidism, unspecified: Secondary | ICD-10-CM | POA: Diagnosis not present

## 2017-10-20 DIAGNOSIS — Z79899 Other long term (current) drug therapy: Secondary | ICD-10-CM | POA: Diagnosis not present

## 2017-10-20 DIAGNOSIS — I10 Essential (primary) hypertension: Secondary | ICD-10-CM | POA: Insufficient documentation

## 2017-10-20 DIAGNOSIS — Z91040 Latex allergy status: Secondary | ICD-10-CM | POA: Insufficient documentation

## 2017-10-20 DIAGNOSIS — Z888 Allergy status to other drugs, medicaments and biological substances status: Secondary | ICD-10-CM | POA: Diagnosis not present

## 2017-10-20 DIAGNOSIS — Z7982 Long term (current) use of aspirin: Secondary | ICD-10-CM | POA: Insufficient documentation

## 2017-10-20 DIAGNOSIS — Z1211 Encounter for screening for malignant neoplasm of colon: Secondary | ICD-10-CM | POA: Diagnosis not present

## 2017-10-20 DIAGNOSIS — Z9221 Personal history of antineoplastic chemotherapy: Secondary | ICD-10-CM | POA: Insufficient documentation

## 2017-10-20 DIAGNOSIS — E119 Type 2 diabetes mellitus without complications: Secondary | ICD-10-CM | POA: Insufficient documentation

## 2017-10-20 DIAGNOSIS — K5730 Diverticulosis of large intestine without perforation or abscess without bleeding: Secondary | ICD-10-CM | POA: Insufficient documentation

## 2017-10-20 DIAGNOSIS — Z882 Allergy status to sulfonamides status: Secondary | ICD-10-CM | POA: Diagnosis not present

## 2017-10-20 DIAGNOSIS — K5790 Diverticulosis of intestine, part unspecified, without perforation or abscess without bleeding: Secondary | ICD-10-CM | POA: Diagnosis not present

## 2017-10-20 HISTORY — DX: Essential (primary) hypertension: I10

## 2017-10-20 HISTORY — DX: Lymphedema, not elsewhere classified: I89.0

## 2017-10-20 HISTORY — DX: Hypothyroidism, unspecified: E03.9

## 2017-10-20 HISTORY — PX: COLONOSCOPY WITH PROPOFOL: SHX5780

## 2017-10-20 HISTORY — DX: Type 2 diabetes mellitus without complications: E11.9

## 2017-10-20 SURGERY — COLONOSCOPY WITH PROPOFOL
Anesthesia: General

## 2017-10-20 MED ORDER — LIDOCAINE HCL (PF) 2 % IJ SOLN
INTRAMUSCULAR | Status: AC
  Filled 2017-10-20: qty 10

## 2017-10-20 MED ORDER — PROPOFOL 10 MG/ML IV BOLUS
INTRAVENOUS | Status: DC | PRN
  Administered 2017-10-20: 19 mg via INTRAVENOUS
  Administered 2017-10-20: 80 mg via INTRAVENOUS

## 2017-10-20 MED ORDER — PROPOFOL 500 MG/50ML IV EMUL
INTRAVENOUS | Status: DC | PRN
  Administered 2017-10-20: 135 ug/kg/min via INTRAVENOUS

## 2017-10-20 MED ORDER — LIDOCAINE HCL (CARDIAC) PF 100 MG/5ML IV SOSY
PREFILLED_SYRINGE | INTRAVENOUS | Status: DC | PRN
  Administered 2017-10-20: 50 mg via INTRAVENOUS

## 2017-10-20 MED ORDER — PROPOFOL 500 MG/50ML IV EMUL
INTRAVENOUS | Status: AC
  Filled 2017-10-20: qty 50

## 2017-10-20 MED ORDER — SODIUM CHLORIDE 0.9 % IV SOLN
INTRAVENOUS | Status: DC
  Administered 2017-10-20: 1000 mL via INTRAVENOUS

## 2017-10-20 NOTE — Anesthesia Preprocedure Evaluation (Addendum)
Anesthesia Evaluation  Patient identified by MRN, date of birth, ID band Patient awake    Reviewed: Allergy & Precautions, H&P , NPO status , Patient's Chart, lab work & pertinent test results  History of Anesthesia Complications Negative for: history of anesthetic complications  Airway Mallampati: III  TM Distance: <3 FB Neck ROM: limited    Dental  (+) Poor Dentition, Edentulous Upper, Edentulous Lower, Upper Dentures, Lower Dentures   Pulmonary neg pulmonary ROS, neg shortness of breath,           Cardiovascular Exercise Tolerance: Good hypertension, (-) angina(-) Past MI and (-) DOE negative cardio ROS       Neuro/Psych negative neurological ROS  negative psych ROS   GI/Hepatic negative GI ROS, Neg liver ROS,   Endo/Other  diabetes, Type 2Hypothyroidism   Renal/GU negative Renal ROS  negative genitourinary   Musculoskeletal   Abdominal   Peds  Hematology negative hematology ROS (+)   Anesthesia Other Findings Past Medical History: 2000: Breast cancer ([place])     Comment:  left breast No date: Diabetes mellitus without complication ([facility]) No date: Hypertension No date: Hypothyroidism No date: Lymphedema of left arm No date: Personal history of chemotherapy No date: Personal history of radiation therapy  Past Surgical History: No date: APPENDECTOMY 2002?: BREAST BIOPSY; Left     Comment:  neg 2000: BREAST EXCISIONAL BIOPSY; Left     Comment:  positive, radiation and chemo No date: TUBAL LIGATION  BMI    Body Mass Index:  31.47 kg/m      Reproductive/Obstetrics negative OB ROS                            Anesthesia Physical Anesthesia Plan  ASA: III  Anesthesia Plan: General   Post-op Pain Management:    Induction: Intravenous  PONV Risk Score and Plan: Propofol infusion and TIVA  Airway Management Planned: Natural Airway and Nasal Cannula  Additional Equipment:    Intra-op Plan:   Post-operative Plan:   Informed Consent: I have reviewed the patients History and Physical, chart, labs and discussed the procedure including the risks, benefits and alternatives for the proposed anesthesia with the patient or authorized representative who has indicated his/her understanding and acceptance.   Dental Advisory Given  Plan Discussed with: Anesthesiologist, CRNA and Surgeon  Anesthesia Plan Comments: (Patient consented for risks of anesthesia including but not limited to:  - adverse reactions to medications - risk of intubation if required - damage to teeth, lips or other oral mucosa - sore throat or hoarseness - Damage to heart, brain, lungs or loss of life  Patient voiced understanding.)        Anesthesia Quick Evaluation

## 2017-10-20 NOTE — Transfer of Care (Signed)
Immediate Anesthesia Transfer of Care Note  Patient: Carmen Hamilton  Procedure(s) Performed: COLONOSCOPY WITH PROPOFOL (N/A )  Patient Location: PACU and Endoscopy Unit  Anesthesia Type:General  Level of Consciousness: drowsy  Airway & Oxygen Therapy: Patient Spontanous Breathing and Patient connected to nasal cannula oxygen  Post-op Assessment: Report given to RN and Post -op Vital signs reviewed and stable  Post vital signs: Reviewed and stable  Last Vitals:  Vitals Value Taken Time  BP    Temp    Pulse 63 10/20/2017 11:41 AM  Resp 11 10/20/2017 11:41 AM  SpO2 100 % 10/20/2017 11:41 AM  Vitals shown include unvalidated device data.  Last Pain:  Vitals:   10/20/17 0944  TempSrc: Tympanic  PainSc: 0-No pain         Complications: No apparent anesthesia complications

## 2017-10-20 NOTE — Anesthesia Post-op Follow-up Note (Signed)
Anesthesia QCDR form completed.        

## 2017-10-20 NOTE — H&P (Addendum)
Outpatient short stay form Pre-procedure 10/20/2017 11:01 AM Carmen Sails MD  Primary Physician: Gaetano Net, NP  Reason for visit: Colonoscopy  History of present illness: Patient is a 72 year old female presenting today as above.  Her last colonoscopy was in 2002.  There are no polyps at that time.  Tolerating her prep well.  She takes no aspirin or blood thinning agent.  She does have a Hx of latex allergy.  Does indicate taking 81 mg of aspirin daily.    Current Facility-Administered Medications:  .  0.9 %  sodium chloride infusion, , Intravenous, Continuous, Carmen Sails, MD, Last Rate: 20 mL/hr at 10/20/17 0955, 1,000 mL at 10/20/17 0955  Medications Prior to Admission  Medication Sig Dispense Refill Last Dose  . aspirin EC 81 MG tablet Take 81 mg by mouth daily.   Past Week at Unknown time  . Calcium Carbonate-Vitamin D 600-400 MG-UNIT tablet Take 1 tablet by mouth daily.   Past Week at Unknown time  . cholecalciferol (VITAMIN D) 400 units TABS tablet Take 400 Units by mouth.   Past Week at Unknown time  . losartan (COZAAR) 25 MG tablet Take 25 mg by mouth daily.   Past Week at Unknown time  . Multiple Vitamin (MULTIVITAMIN) tablet Take 1 tablet by mouth daily.   Past Week at Unknown time  . triamterene-hydrochlorothiazide (MAXZIDE-25) 37.5-25 MG tablet Take 1 tablet by mouth daily.   Past Week at Unknown time     Allergies  Allergen Reactions  . Avandia [Rosiglitazone]   . Latex   . Sulfa Antibiotics      Past Medical History:  Diagnosis Date  . Breast cancer ([place]) 2000   left breast  . Diabetes mellitus without complication ([place])   . Hypertension   . Hypothyroidism   . Lymphedema of left arm   . Personal history of chemotherapy   . Personal history of radiation therapy     Review of systems:      Physical Exam    Heart and lungs: Without rub or gallop, lungs are bilaterally clear.    HEENT: Normocephalic atraumatic eyes are anicteric    Other:    Pertinant exam for procedure: Soft nontender nondistended bowel sounds positive normoactive.  Of note there is a fullness in the left lower quadrant to palpation.    Planned proceedures: Colonoscopy and indicated procedures. I have discussed the risks benefits and complications of procedures to include not limited to bleeding, infection, perforation and the risk of sedation and the patient wishes to proceed.    Carmen Sails, MD Gastroenterology 10/20/2017  11:01 AM

## 2017-10-20 NOTE — Op Note (Signed)
[facility] Gastroenterology Patient Name: Carmen Hamilton Procedure Date: 10/20/2017 11:05 AM MRN: [id number] Account #: [id number] Date of Birth: 1945-09-18 Admit Type: Outpatient Age: 72 Room: [facility] - Closed ENDO ROOM 2 Gender: Female Note Status: Finalized Procedure:            Colonoscopy Indications:          Screening for colorectal malignant neoplasm Providers:            Lollie Sails, MD Referring MD:         Juluis Rainier (Referring MD) Medicines:            Monitored Anesthesia Care Complications:        No immediate complications. Procedure:            Pre-Anesthesia Assessment:                       - ASA Grade Assessment: III - A patient with severe                        systemic disease.                       After obtaining informed consent, the colonoscope was                        passed under direct vision. Throughout the procedure,                        the patient's blood pressure, pulse, and oxygen                        saturations were monitored continuously. The                        Colonoscope was introduced through the anus and                        advanced to the the terminal ileum. The colonoscopy was                        performed without difficulty. The patient tolerated the                        procedure well. The quality of the bowel preparation                        was good. Findings:      Multiple small-mouthed diverticula were found in the sigmoid colon and       descending colon.      The retroflexed view of the distal rectum and anal verge was normal and       showed no anal or rectal abnormalities.      The digital rectal exam was normal.      The opening of the terminal ileum appeared normal. I was unable to       intubate past about 2 cm due to a sharp angulation. Impression:           - Diverticulosis in the sigmoid colon and in the  descending colon.                       - The examined  portion of the ileum was normal.                       - No specimens collected. Recommendation:       - Discharge patient to home.                       - Advance diet as tolerated. Procedure Code(s):    --- Professional ---                       [phone], Colonoscopy, flexible; diagnostic, including                        collection of specimen(s) by brushing or washing, when                        performed (separate procedure) Diagnosis Code(s):    --- Professional ---                       Z12.11, Encounter for screening for malignant neoplasm                        of colon                       K57.30, Diverticulosis of large intestine without                        perforation or abscess without bleeding CPT copyright 2017 [employer]. All rights reserved. The codes documented in this report are preliminary and upon coder review may  be revised to meet current compliance requirements. Lollie Sails, MD 10/20/2017 11:41:00 AM This report has been signed electronically. Number of Addenda: 0 Note Initiated On: 10/20/2017 11:05 AM Scope Withdrawal Time: 0 hours 12 minutes 55 seconds  Total Procedure Duration: 0 hours 18 minutes 50 seconds       [facility]

## 2017-10-21 NOTE — Anesthesia Postprocedure Evaluation (Signed)
Anesthesia Post Note  Patient: Carmen Hamilton  Procedure(s) Performed: COLONOSCOPY WITH PROPOFOL (N/A )  Patient location during evaluation: Endoscopy Anesthesia Type: General Level of consciousness: awake and alert Pain management: pain level controlled Vital Signs Assessment: post-procedure vital signs reviewed and stable Respiratory status: spontaneous breathing, nonlabored ventilation, respiratory function stable and patient connected to nasal cannula oxygen Cardiovascular status: blood pressure returned to baseline and stable Postop Assessment: no apparent nausea or vomiting Anesthetic complications: no     Last Vitals:  Vitals:   10/20/17 1201 10/20/17 1211  BP: (!) 155/73 (!) 149/82  Pulse: 60 61  Resp: 13 13  Temp:    SpO2: 100% 100%    Last Pain:  Vitals:   10/20/17 1211  TempSrc:   PainSc: 0-No pain                 Precious Haws Piscitello

## 2017-10-29 DIAGNOSIS — E1136 Type 2 diabetes mellitus with diabetic cataract: Secondary | ICD-10-CM | POA: Diagnosis not present

## 2017-10-29 DIAGNOSIS — H2511 Age-related nuclear cataract, right eye: Secondary | ICD-10-CM | POA: Diagnosis not present

## 2017-10-29 DIAGNOSIS — Z961 Presence of intraocular lens: Secondary | ICD-10-CM | POA: Diagnosis not present

## 2017-10-29 DIAGNOSIS — I10 Essential (primary) hypertension: Secondary | ICD-10-CM | POA: Diagnosis not present

## 2017-10-29 DIAGNOSIS — Z79899 Other long term (current) drug therapy: Secondary | ICD-10-CM | POA: Diagnosis not present

## 2017-10-29 DIAGNOSIS — H53002 Unspecified amblyopia, left eye: Secondary | ICD-10-CM | POA: Diagnosis not present

## 2017-10-29 DIAGNOSIS — H02836 Dermatochalasis of left eye, unspecified eyelid: Secondary | ICD-10-CM | POA: Diagnosis not present

## 2017-10-29 DIAGNOSIS — H02833 Dermatochalasis of right eye, unspecified eyelid: Secondary | ICD-10-CM | POA: Diagnosis not present

## 2017-10-29 DIAGNOSIS — C50919 Malignant neoplasm of unspecified site of unspecified female breast: Secondary | ICD-10-CM | POA: Diagnosis not present

## 2017-10-29 DIAGNOSIS — Z7982 Long term (current) use of aspirin: Secondary | ICD-10-CM | POA: Diagnosis not present

## 2017-10-29 DIAGNOSIS — H25812 Combined forms of age-related cataract, left eye: Secondary | ICD-10-CM | POA: Diagnosis not present

## 2017-10-29 DIAGNOSIS — H2513 Age-related nuclear cataract, bilateral: Secondary | ICD-10-CM | POA: Diagnosis not present

## 2017-11-12 ENCOUNTER — Other Ambulatory Visit: Payer: Self-pay | Admitting: Gastroenterology

## 2017-11-12 DIAGNOSIS — R198 Other specified symptoms and signs involving the digestive system and abdomen: Secondary | ICD-10-CM

## 2017-11-13 ENCOUNTER — Ambulatory Visit: Admission: RE | Admit: 2017-11-13 | Payer: Medicare HMO | Source: Ambulatory Visit

## 2017-11-13 DIAGNOSIS — R1031 Right lower quadrant pain: Secondary | ICD-10-CM | POA: Diagnosis not present

## 2017-11-13 DIAGNOSIS — G8929 Other chronic pain: Secondary | ICD-10-CM | POA: Diagnosis not present

## 2017-11-13 DIAGNOSIS — R1012 Left upper quadrant pain: Secondary | ICD-10-CM | POA: Diagnosis not present

## 2017-11-26 ENCOUNTER — Other Ambulatory Visit: Payer: Self-pay | Admitting: Gastroenterology

## 2017-11-26 DIAGNOSIS — G8929 Other chronic pain: Secondary | ICD-10-CM

## 2017-11-26 DIAGNOSIS — R1031 Right lower quadrant pain: Secondary | ICD-10-CM

## 2017-11-26 DIAGNOSIS — R1012 Left upper quadrant pain: Secondary | ICD-10-CM

## 2017-11-28 ENCOUNTER — Ambulatory Visit
Admission: RE | Admit: 2017-11-28 | Discharge: 2017-11-28 | Disposition: A | Discharge status: Home / Self Care | Payer: Medicare HMO | Source: Ambulatory Visit | Attending: Gastroenterology | Admitting: Gastroenterology

## 2017-11-28 DIAGNOSIS — R1031 Right lower quadrant pain: Secondary | ICD-10-CM | POA: Diagnosis present

## 2017-11-28 DIAGNOSIS — K5792 Diverticulitis of intestine, part unspecified, without perforation or abscess without bleeding: Secondary | ICD-10-CM | POA: Insufficient documentation

## 2017-11-28 DIAGNOSIS — G8929 Other chronic pain: Secondary | ICD-10-CM

## 2017-11-28 DIAGNOSIS — K449 Diaphragmatic hernia without obstruction or gangrene: Secondary | ICD-10-CM | POA: Insufficient documentation

## 2017-11-28 DIAGNOSIS — K5730 Diverticulosis of large intestine without perforation or abscess without bleeding: Secondary | ICD-10-CM | POA: Insufficient documentation

## 2017-11-28 DIAGNOSIS — R1012 Left upper quadrant pain: Secondary | ICD-10-CM

## 2017-11-28 DIAGNOSIS — K5732 Diverticulitis of large intestine without perforation or abscess without bleeding: Secondary | ICD-10-CM | POA: Diagnosis not present

## 2017-11-28 DIAGNOSIS — I700 Atherosclerosis of aorta: Secondary | ICD-10-CM | POA: Diagnosis not present

## 2017-11-28 LAB — POCT I-STAT CREATININE: CREATININE: 0.8 mg/dL (ref 0.44–1.00)

## 2017-11-28 MED ORDER — IOPAMIDOL (ISOVUE-300) INJECTION 61%
100.0000 mL | Freq: Once | INTRAVENOUS | Status: AC | PRN
  Administered 2017-11-28: 100 mL via INTRAVENOUS

## 2017-12-08 ENCOUNTER — Other Ambulatory Visit: Payer: Self-pay | Admitting: Nurse Practitioner

## 2017-12-08 DIAGNOSIS — Z1231 Encounter for screening mammogram for malignant neoplasm of breast: Secondary | ICD-10-CM

## 2017-12-31 DIAGNOSIS — K5792 Diverticulitis of intestine, part unspecified, without perforation or abscess without bleeding: Secondary | ICD-10-CM | POA: Diagnosis not present

## 2017-12-31 DIAGNOSIS — K219 Gastro-esophageal reflux disease without esophagitis: Secondary | ICD-10-CM | POA: Diagnosis not present

## 2018-01-14 ENCOUNTER — Ambulatory Visit
Admission: RE | Admit: 2018-01-14 | Discharge: 2018-01-14 | Disposition: A | Discharge status: Home / Self Care | Payer: Medicare HMO | Source: Ambulatory Visit | Attending: Nurse Practitioner | Admitting: Nurse Practitioner

## 2018-01-14 DIAGNOSIS — Z1231 Encounter for screening mammogram for malignant neoplasm of breast: Secondary | ICD-10-CM | POA: Diagnosis not present

## 2018-01-27 DIAGNOSIS — I10 Essential (primary) hypertension: Secondary | ICD-10-CM | POA: Diagnosis not present

## 2018-01-27 DIAGNOSIS — Z0000 Encounter for general adult medical examination without abnormal findings: Secondary | ICD-10-CM | POA: Diagnosis not present

## 2018-01-27 DIAGNOSIS — E119 Type 2 diabetes mellitus without complications: Secondary | ICD-10-CM | POA: Diagnosis not present

## 2018-01-27 DIAGNOSIS — Z79899 Other long term (current) drug therapy: Secondary | ICD-10-CM | POA: Diagnosis not present

## 2018-01-27 DIAGNOSIS — E669 Obesity, unspecified: Secondary | ICD-10-CM | POA: Diagnosis not present

## 2018-06-15 DIAGNOSIS — H26492 Other secondary cataract, left eye: Secondary | ICD-10-CM | POA: Diagnosis not present

## 2018-06-15 DIAGNOSIS — Z961 Presence of intraocular lens: Secondary | ICD-10-CM | POA: Diagnosis not present

## 2018-06-15 DIAGNOSIS — H40003 Preglaucoma, unspecified, bilateral: Secondary | ICD-10-CM | POA: Diagnosis not present

## 2018-07-28 DIAGNOSIS — C50912 Malignant neoplasm of unspecified site of left female breast: Secondary | ICD-10-CM | POA: Diagnosis not present

## 2018-07-28 DIAGNOSIS — Z79899 Other long term (current) drug therapy: Secondary | ICD-10-CM | POA: Diagnosis not present

## 2018-07-28 DIAGNOSIS — E669 Obesity, unspecified: Secondary | ICD-10-CM | POA: Diagnosis not present

## 2018-07-28 DIAGNOSIS — I10 Essential (primary) hypertension: Secondary | ICD-10-CM | POA: Diagnosis not present

## 2018-07-28 DIAGNOSIS — E119 Type 2 diabetes mellitus without complications: Secondary | ICD-10-CM | POA: Diagnosis not present

## 2018-09-03 DIAGNOSIS — L579 Skin changes due to chronic exposure to nonionizing radiation, unspecified: Secondary | ICD-10-CM | POA: Diagnosis not present

## 2018-09-03 DIAGNOSIS — L570 Actinic keratosis: Secondary | ICD-10-CM | POA: Diagnosis not present

## 2018-09-03 DIAGNOSIS — L723 Sebaceous cyst: Secondary | ICD-10-CM | POA: Diagnosis not present

## 2018-12-28 ENCOUNTER — Other Ambulatory Visit: Payer: Self-pay | Admitting: Nurse Practitioner

## 2018-12-28 DIAGNOSIS — Z1231 Encounter for screening mammogram for malignant neoplasm of breast: Secondary | ICD-10-CM

## 2018-12-29 DIAGNOSIS — Z23 Encounter for immunization: Secondary | ICD-10-CM | POA: Diagnosis not present

## 2019-01-04 DIAGNOSIS — H40003 Preglaucoma, unspecified, bilateral: Secondary | ICD-10-CM | POA: Diagnosis not present

## 2019-01-04 DIAGNOSIS — H26492 Other secondary cataract, left eye: Secondary | ICD-10-CM | POA: Diagnosis not present

## 2019-01-04 DIAGNOSIS — Z961 Presence of intraocular lens: Secondary | ICD-10-CM | POA: Diagnosis not present

## 2019-01-28 DIAGNOSIS — E669 Obesity, unspecified: Secondary | ICD-10-CM | POA: Diagnosis not present

## 2019-01-28 DIAGNOSIS — Z0000 Encounter for general adult medical examination without abnormal findings: Secondary | ICD-10-CM | POA: Diagnosis not present

## 2019-01-28 DIAGNOSIS — Z79899 Other long term (current) drug therapy: Secondary | ICD-10-CM | POA: Diagnosis not present

## 2019-01-28 DIAGNOSIS — C50912 Malignant neoplasm of unspecified site of left female breast: Secondary | ICD-10-CM | POA: Diagnosis not present

## 2019-01-28 DIAGNOSIS — I10 Essential (primary) hypertension: Secondary | ICD-10-CM | POA: Diagnosis not present

## 2019-01-28 DIAGNOSIS — E119 Type 2 diabetes mellitus without complications: Secondary | ICD-10-CM | POA: Diagnosis not present

## 2019-01-28 DIAGNOSIS — Z13820 Encounter for screening for osteoporosis: Secondary | ICD-10-CM | POA: Diagnosis not present

## 2019-02-03 ENCOUNTER — Ambulatory Visit
Admission: RE | Admit: 2019-02-03 | Discharge: 2019-02-03 | Disposition: A | Discharge status: Home / Self Care | Payer: Medicare HMO | Source: Ambulatory Visit | Attending: Nurse Practitioner | Admitting: Nurse Practitioner

## 2019-02-03 DIAGNOSIS — Z1231 Encounter for screening mammogram for malignant neoplasm of breast: Secondary | ICD-10-CM | POA: Diagnosis not present

## 2019-02-03 DIAGNOSIS — Z780 Asymptomatic menopausal state: Secondary | ICD-10-CM | POA: Diagnosis not present

## 2019-05-24 DIAGNOSIS — N6320 Unspecified lump in the left breast, unspecified quadrant: Secondary | ICD-10-CM | POA: Diagnosis not present

## 2019-05-24 DIAGNOSIS — Z853 Personal history of malignant neoplasm of breast: Secondary | ICD-10-CM | POA: Diagnosis not present

## 2019-05-26 ENCOUNTER — Other Ambulatory Visit: Payer: Self-pay | Admitting: Gerontology

## 2019-05-26 DIAGNOSIS — N6320 Unspecified lump in the left breast, unspecified quadrant: Secondary | ICD-10-CM

## 2019-05-27 ENCOUNTER — Other Ambulatory Visit: Payer: Self-pay | Admitting: Gerontology

## 2019-05-27 DIAGNOSIS — N6320 Unspecified lump in the left breast, unspecified quadrant: Secondary | ICD-10-CM

## 2019-06-04 ENCOUNTER — Ambulatory Visit
Admission: RE | Admit: 2019-06-04 | Discharge: 2019-06-04 | Disposition: A | Discharge status: Home / Self Care | Payer: Medicare HMO | Source: Ambulatory Visit | Attending: Gerontology | Admitting: Gerontology

## 2019-06-04 DIAGNOSIS — N6325 Unspecified lump in the left breast, overlapping quadrants: Secondary | ICD-10-CM | POA: Diagnosis not present

## 2019-06-04 DIAGNOSIS — N6320 Unspecified lump in the left breast, unspecified quadrant: Secondary | ICD-10-CM

## 2019-06-04 DIAGNOSIS — R928 Other abnormal and inconclusive findings on diagnostic imaging of breast: Secondary | ICD-10-CM | POA: Diagnosis not present

## 2019-06-04 DIAGNOSIS — N6489 Other specified disorders of breast: Secondary | ICD-10-CM | POA: Diagnosis not present

## 2019-07-05 DIAGNOSIS — H524 Presbyopia: Secondary | ICD-10-CM | POA: Diagnosis not present

## 2019-08-19 DIAGNOSIS — Z79899 Other long term (current) drug therapy: Secondary | ICD-10-CM | POA: Diagnosis not present

## 2019-08-19 DIAGNOSIS — Z853 Personal history of malignant neoplasm of breast: Secondary | ICD-10-CM | POA: Diagnosis not present

## 2019-08-19 DIAGNOSIS — R413 Other amnesia: Secondary | ICD-10-CM | POA: Diagnosis not present

## 2019-08-19 DIAGNOSIS — Z6834 Body mass index (BMI) 34.0-34.9, adult: Secondary | ICD-10-CM | POA: Diagnosis not present

## 2019-08-19 DIAGNOSIS — I10 Essential (primary) hypertension: Secondary | ICD-10-CM | POA: Diagnosis not present

## 2019-08-19 DIAGNOSIS — E669 Obesity, unspecified: Secondary | ICD-10-CM | POA: Diagnosis not present

## 2019-08-19 DIAGNOSIS — E119 Type 2 diabetes mellitus without complications: Secondary | ICD-10-CM | POA: Diagnosis not present

## 2019-12-29 ENCOUNTER — Other Ambulatory Visit: Payer: Self-pay | Admitting: Nurse Practitioner

## 2019-12-29 DIAGNOSIS — Z1231 Encounter for screening mammogram for malignant neoplasm of breast: Secondary | ICD-10-CM

## 2020-01-05 DIAGNOSIS — H353132 Nonexudative age-related macular degeneration, bilateral, intermediate dry stage: Secondary | ICD-10-CM | POA: Diagnosis not present

## 2020-02-21 ENCOUNTER — Ambulatory Visit
Admission: RE | Admit: 2020-02-21 | Discharge: 2020-02-21 | Disposition: A | Discharge status: Home / Self Care | Payer: Medicare HMO | Source: Ambulatory Visit | Attending: Nurse Practitioner | Admitting: Nurse Practitioner

## 2020-02-21 ENCOUNTER — Other Ambulatory Visit: Payer: Self-pay

## 2020-02-21 DIAGNOSIS — Z1231 Encounter for screening mammogram for malignant neoplasm of breast: Secondary | ICD-10-CM | POA: Diagnosis not present

## 2020-03-13 DIAGNOSIS — E538 Deficiency of other specified B group vitamins: Secondary | ICD-10-CM | POA: Diagnosis not present

## 2020-03-13 DIAGNOSIS — I10 Essential (primary) hypertension: Secondary | ICD-10-CM | POA: Diagnosis not present

## 2020-03-13 DIAGNOSIS — E669 Obesity, unspecified: Secondary | ICD-10-CM | POA: Diagnosis not present

## 2020-03-13 DIAGNOSIS — Z6834 Body mass index (BMI) 34.0-34.9, adult: Secondary | ICD-10-CM | POA: Diagnosis not present

## 2020-03-13 DIAGNOSIS — Z853 Personal history of malignant neoplasm of breast: Secondary | ICD-10-CM | POA: Diagnosis not present

## 2020-03-13 DIAGNOSIS — E119 Type 2 diabetes mellitus without complications: Secondary | ICD-10-CM | POA: Diagnosis not present

## 2020-03-13 DIAGNOSIS — K5730 Diverticulosis of large intestine without perforation or abscess without bleeding: Secondary | ICD-10-CM | POA: Diagnosis not present

## 2020-04-04 DIAGNOSIS — Z1211 Encounter for screening for malignant neoplasm of colon: Secondary | ICD-10-CM | POA: Diagnosis not present

## 2020-04-04 DIAGNOSIS — Z13820 Encounter for screening for osteoporosis: Secondary | ICD-10-CM | POA: Diagnosis not present

## 2020-04-04 DIAGNOSIS — Z0001 Encounter for general adult medical examination with abnormal findings: Secondary | ICD-10-CM | POA: Diagnosis not present

## 2020-04-04 DIAGNOSIS — Z136 Encounter for screening for cardiovascular disorders: Secondary | ICD-10-CM | POA: Diagnosis not present

## 2020-04-04 DIAGNOSIS — I10 Essential (primary) hypertension: Secondary | ICD-10-CM | POA: Diagnosis not present

## 2020-04-04 DIAGNOSIS — E538 Deficiency of other specified B group vitamins: Secondary | ICD-10-CM | POA: Diagnosis not present

## 2020-04-04 DIAGNOSIS — E119 Type 2 diabetes mellitus without complications: Secondary | ICD-10-CM | POA: Diagnosis not present

## 2020-04-04 DIAGNOSIS — E559 Vitamin D deficiency, unspecified: Secondary | ICD-10-CM | POA: Diagnosis not present

## 2020-04-04 DIAGNOSIS — Z1231 Encounter for screening mammogram for malignant neoplasm of breast: Secondary | ICD-10-CM | POA: Diagnosis not present

## 2020-04-11 DIAGNOSIS — Z780 Asymptomatic menopausal state: Secondary | ICD-10-CM | POA: Diagnosis not present

## 2020-04-18 DIAGNOSIS — E119 Type 2 diabetes mellitus without complications: Secondary | ICD-10-CM | POA: Diagnosis not present

## 2020-04-18 DIAGNOSIS — I10 Essential (primary) hypertension: Secondary | ICD-10-CM | POA: Diagnosis not present

## 2020-04-18 DIAGNOSIS — E669 Obesity, unspecified: Secondary | ICD-10-CM | POA: Diagnosis not present

## 2020-04-18 DIAGNOSIS — E538 Deficiency of other specified B group vitamins: Secondary | ICD-10-CM | POA: Diagnosis not present

## 2020-04-18 DIAGNOSIS — Z6834 Body mass index (BMI) 34.0-34.9, adult: Secondary | ICD-10-CM | POA: Diagnosis not present

## 2020-04-18 DIAGNOSIS — E559 Vitamin D deficiency, unspecified: Secondary | ICD-10-CM | POA: Diagnosis not present

## 2020-05-08 DIAGNOSIS — R309 Painful micturition, unspecified: Secondary | ICD-10-CM | POA: Diagnosis not present

## 2020-05-08 DIAGNOSIS — R1030 Lower abdominal pain, unspecified: Secondary | ICD-10-CM | POA: Diagnosis not present

## 2020-05-08 DIAGNOSIS — N390 Urinary tract infection, site not specified: Secondary | ICD-10-CM | POA: Diagnosis not present

## 2020-05-08 DIAGNOSIS — R3915 Urgency of urination: Secondary | ICD-10-CM | POA: Diagnosis not present

## 2020-05-08 DIAGNOSIS — Z6833 Body mass index (BMI) 33.0-33.9, adult: Secondary | ICD-10-CM | POA: Diagnosis not present

## 2020-08-24 DIAGNOSIS — H524 Presbyopia: Secondary | ICD-10-CM | POA: Diagnosis not present

## 2021-02-20 ENCOUNTER — Other Ambulatory Visit: Payer: Self-pay | Admitting: Nurse Practitioner

## 2021-10-16 DIAGNOSIS — Z961 Presence of intraocular lens: Secondary | ICD-10-CM | POA: Diagnosis not present

## 2021-10-16 DIAGNOSIS — H5203 Hypermetropia, bilateral: Secondary | ICD-10-CM | POA: Diagnosis not present

## 2021-10-16 DIAGNOSIS — H52223 Regular astigmatism, bilateral: Secondary | ICD-10-CM | POA: Diagnosis not present

## 2021-10-16 DIAGNOSIS — H353131 Nonexudative age-related macular degeneration, bilateral, early dry stage: Secondary | ICD-10-CM | POA: Diagnosis not present

## 2021-10-16 DIAGNOSIS — Z0100 Encounter for examination of eyes and vision without abnormal findings: Secondary | ICD-10-CM | POA: Diagnosis not present

## 2021-10-22 DIAGNOSIS — Z6834 Body mass index (BMI) 34.0-34.9, adult: Secondary | ICD-10-CM | POA: Diagnosis not present

## 2021-10-22 DIAGNOSIS — E119 Type 2 diabetes mellitus without complications: Secondary | ICD-10-CM | POA: Diagnosis not present

## 2021-10-22 DIAGNOSIS — I10 Essential (primary) hypertension: Secondary | ICD-10-CM | POA: Diagnosis not present

## 2021-10-22 DIAGNOSIS — Z113 Encounter for screening for infections with a predominantly sexual mode of transmission: Secondary | ICD-10-CM | POA: Diagnosis not present

## 2021-10-22 DIAGNOSIS — R413 Other amnesia: Secondary | ICD-10-CM | POA: Diagnosis not present

## 2021-10-22 DIAGNOSIS — M25562 Pain in left knee: Secondary | ICD-10-CM | POA: Diagnosis not present

## 2021-10-22 DIAGNOSIS — E538 Deficiency of other specified B group vitamins: Secondary | ICD-10-CM | POA: Diagnosis not present

## 2021-10-22 DIAGNOSIS — E669 Obesity, unspecified: Secondary | ICD-10-CM | POA: Diagnosis not present

## 2021-10-31 DIAGNOSIS — M25462 Effusion, left knee: Secondary | ICD-10-CM | POA: Diagnosis not present

## 2021-10-31 DIAGNOSIS — M25562 Pain in left knee: Secondary | ICD-10-CM | POA: Diagnosis not present

## 2021-11-09 DIAGNOSIS — S83232A Complex tear of medial meniscus, current injury, left knee, initial encounter: Secondary | ICD-10-CM | POA: Diagnosis not present

## 2021-11-09 DIAGNOSIS — M1712 Unilateral primary osteoarthritis, left knee: Secondary | ICD-10-CM | POA: Diagnosis not present

## 2021-11-09 DIAGNOSIS — M25462 Effusion, left knee: Secondary | ICD-10-CM | POA: Diagnosis not present

## 2021-11-13 DIAGNOSIS — M1712 Unilateral primary osteoarthritis, left knee: Secondary | ICD-10-CM | POA: Diagnosis not present

## 2021-11-30 DIAGNOSIS — N950 Postmenopausal bleeding: Secondary | ICD-10-CM | POA: Diagnosis not present

## 2021-11-30 DIAGNOSIS — Z6834 Body mass index (BMI) 34.0-34.9, adult: Secondary | ICD-10-CM | POA: Diagnosis not present

## 2021-11-30 DIAGNOSIS — Z01419 Encounter for gynecological examination (general) (routine) without abnormal findings: Secondary | ICD-10-CM | POA: Diagnosis not present

## 2021-11-30 DIAGNOSIS — Z853 Personal history of malignant neoplasm of breast: Secondary | ICD-10-CM | POA: Diagnosis not present

## 2021-12-05 DIAGNOSIS — N950 Postmenopausal bleeding: Secondary | ICD-10-CM | POA: Diagnosis not present

## 2021-12-05 DIAGNOSIS — N888 Other specified noninflammatory disorders of cervix uteri: Secondary | ICD-10-CM | POA: Diagnosis not present

## 2022-01-10 DIAGNOSIS — J40 Bronchitis, not specified as acute or chronic: Secondary | ICD-10-CM | POA: Diagnosis not present

## 2022-01-10 DIAGNOSIS — Z6833 Body mass index (BMI) 33.0-33.9, adult: Secondary | ICD-10-CM | POA: Diagnosis not present

## 2022-01-10 DIAGNOSIS — J329 Chronic sinusitis, unspecified: Secondary | ICD-10-CM | POA: Diagnosis not present

## 2022-01-14 DIAGNOSIS — Z6834 Body mass index (BMI) 34.0-34.9, adult: Secondary | ICD-10-CM | POA: Diagnosis not present

## 2022-01-14 DIAGNOSIS — J40 Bronchitis, not specified as acute or chronic: Secondary | ICD-10-CM | POA: Diagnosis not present

## 2022-01-14 DIAGNOSIS — J329 Chronic sinusitis, unspecified: Secondary | ICD-10-CM | POA: Diagnosis not present

## 2022-03-26 DIAGNOSIS — R9389 Abnormal findings on diagnostic imaging of other specified body structures: Secondary | ICD-10-CM | POA: Diagnosis not present

## 2022-03-26 DIAGNOSIS — N950 Postmenopausal bleeding: Secondary | ICD-10-CM | POA: Diagnosis not present

## 2022-04-22 DIAGNOSIS — Z853 Personal history of malignant neoplasm of breast: Secondary | ICD-10-CM | POA: Diagnosis not present

## 2022-04-22 DIAGNOSIS — Z79899 Other long term (current) drug therapy: Secondary | ICD-10-CM | POA: Diagnosis not present

## 2022-04-22 DIAGNOSIS — E669 Obesity, unspecified: Secondary | ICD-10-CM | POA: Diagnosis not present

## 2022-04-22 DIAGNOSIS — K219 Gastro-esophageal reflux disease without esophagitis: Secondary | ICD-10-CM | POA: Diagnosis not present

## 2022-04-22 DIAGNOSIS — R413 Other amnesia: Secondary | ICD-10-CM | POA: Diagnosis not present

## 2022-04-22 DIAGNOSIS — I10 Essential (primary) hypertension: Secondary | ICD-10-CM | POA: Diagnosis not present

## 2022-04-22 DIAGNOSIS — E119 Type 2 diabetes mellitus without complications: Secondary | ICD-10-CM | POA: Diagnosis not present

## 2022-04-22 DIAGNOSIS — N8500 Endometrial hyperplasia, unspecified: Secondary | ICD-10-CM | POA: Diagnosis not present

## 2022-04-22 DIAGNOSIS — Z0000 Encounter for general adult medical examination without abnormal findings: Secondary | ICD-10-CM | POA: Diagnosis not present

## 2022-04-26 DIAGNOSIS — Z1231 Encounter for screening mammogram for malignant neoplasm of breast: Secondary | ICD-10-CM | POA: Diagnosis not present

## 2022-05-20 DIAGNOSIS — R9389 Abnormal findings on diagnostic imaging of other specified body structures: Secondary | ICD-10-CM | POA: Diagnosis not present

## 2022-05-20 DIAGNOSIS — N840 Polyp of corpus uteri: Secondary | ICD-10-CM | POA: Diagnosis not present

## 2022-05-20 DIAGNOSIS — N950 Postmenopausal bleeding: Secondary | ICD-10-CM | POA: Diagnosis not present

## 2022-06-11 DIAGNOSIS — N950 Postmenopausal bleeding: Secondary | ICD-10-CM | POA: Diagnosis not present

## 2022-06-11 DIAGNOSIS — Z79899 Other long term (current) drug therapy: Secondary | ICD-10-CM | POA: Diagnosis not present

## 2022-06-11 DIAGNOSIS — N840 Polyp of corpus uteri: Secondary | ICD-10-CM | POA: Diagnosis not present

## 2022-06-11 DIAGNOSIS — R9389 Abnormal findings on diagnostic imaging of other specified body structures: Secondary | ICD-10-CM | POA: Diagnosis not present

## 2022-06-11 DIAGNOSIS — N841 Polyp of cervix uteri: Secondary | ICD-10-CM | POA: Diagnosis not present

## 2022-06-24 DIAGNOSIS — Z09 Encounter for follow-up examination after completed treatment for conditions other than malignant neoplasm: Secondary | ICD-10-CM | POA: Diagnosis not present

## 2022-10-02 DIAGNOSIS — S60561A Insect bite (nonvenomous) of right hand, initial encounter: Secondary | ICD-10-CM | POA: Diagnosis not present

## 2022-11-27 DIAGNOSIS — Z6833 Body mass index (BMI) 33.0-33.9, adult: Secondary | ICD-10-CM | POA: Diagnosis not present

## 2022-11-27 DIAGNOSIS — R350 Frequency of micturition: Secondary | ICD-10-CM | POA: Diagnosis not present

## 2023-03-27 DIAGNOSIS — E669 Obesity, unspecified: Secondary | ICD-10-CM | POA: Diagnosis not present

## 2023-03-27 DIAGNOSIS — E119 Type 2 diabetes mellitus without complications: Secondary | ICD-10-CM | POA: Diagnosis not present

## 2023-03-27 DIAGNOSIS — Z6834 Body mass index (BMI) 34.0-34.9, adult: Secondary | ICD-10-CM | POA: Diagnosis not present

## 2023-03-27 DIAGNOSIS — I10 Essential (primary) hypertension: Secondary | ICD-10-CM | POA: Diagnosis not present

## 2023-04-29 DIAGNOSIS — E119 Type 2 diabetes mellitus without complications: Secondary | ICD-10-CM | POA: Diagnosis not present

## 2023-04-29 DIAGNOSIS — K219 Gastro-esophageal reflux disease without esophagitis: Secondary | ICD-10-CM | POA: Diagnosis not present

## 2023-04-29 DIAGNOSIS — Z6835 Body mass index (BMI) 35.0-35.9, adult: Secondary | ICD-10-CM | POA: Diagnosis not present

## 2023-04-29 DIAGNOSIS — I10 Essential (primary) hypertension: Secondary | ICD-10-CM | POA: Diagnosis not present

## 2023-04-29 DIAGNOSIS — Z0000 Encounter for general adult medical examination without abnormal findings: Secondary | ICD-10-CM | POA: Diagnosis not present

## 2023-04-29 DIAGNOSIS — Z1339 Encounter for screening examination for other mental health and behavioral disorders: Secondary | ICD-10-CM | POA: Diagnosis not present

## 2023-04-29 DIAGNOSIS — Z853 Personal history of malignant neoplasm of breast: Secondary | ICD-10-CM | POA: Diagnosis not present

## 2023-04-29 DIAGNOSIS — E669 Obesity, unspecified: Secondary | ICD-10-CM | POA: Diagnosis not present

## 2023-04-29 DIAGNOSIS — R413 Other amnesia: Secondary | ICD-10-CM | POA: Diagnosis not present

## 2023-05-09 DIAGNOSIS — Z1231 Encounter for screening mammogram for malignant neoplasm of breast: Secondary | ICD-10-CM | POA: Diagnosis not present

## 2023-05-13 DIAGNOSIS — Z853 Personal history of malignant neoplasm of breast: Secondary | ICD-10-CM | POA: Diagnosis not present

## 2023-05-13 DIAGNOSIS — Z1231 Encounter for screening mammogram for malignant neoplasm of breast: Secondary | ICD-10-CM | POA: Diagnosis not present

## 2023-06-17 DIAGNOSIS — H353131 Nonexudative age-related macular degeneration, bilateral, early dry stage: Secondary | ICD-10-CM | POA: Diagnosis not present

## 2023-08-08 DIAGNOSIS — H26491 Other secondary cataract, right eye: Secondary | ICD-10-CM | POA: Diagnosis not present

## 2023-08-21 DIAGNOSIS — L03115 Cellulitis of right lower limb: Secondary | ICD-10-CM | POA: Diagnosis not present

## 2023-08-21 DIAGNOSIS — W57XXXA Bitten or stung by nonvenomous insect and other nonvenomous arthropods, initial encounter: Secondary | ICD-10-CM | POA: Diagnosis not present

## 2023-09-12 DIAGNOSIS — I890 Lymphedema, not elsewhere classified: Secondary | ICD-10-CM | POA: Diagnosis not present

## 2023-09-12 DIAGNOSIS — L03114 Cellulitis of left upper limb: Secondary | ICD-10-CM | POA: Diagnosis not present

## 2023-09-16 DIAGNOSIS — Z0100 Encounter for examination of eyes and vision without abnormal findings: Secondary | ICD-10-CM | POA: Diagnosis not present

## 2023-09-29 ENCOUNTER — Encounter: Payer: Self-pay | Admitting: Podiatry

## 2023-09-29 ENCOUNTER — Ambulatory Visit: Admitting: Podiatry

## 2023-09-29 DIAGNOSIS — E1140 Type 2 diabetes mellitus with diabetic neuropathy, unspecified: Secondary | ICD-10-CM

## 2023-09-29 DIAGNOSIS — S90222A Contusion of left lesser toe(s) with damage to nail, initial encounter: Secondary | ICD-10-CM

## 2023-09-29 NOTE — Progress Notes (Unsigned)
 Chief Complaint  Patient presents with   Nail Problem    Left first nail is dark and really short. She stated after Chemo, it fell off and when it grew back it would not stick to the nail bed. She would like to know what would cause it to do this and if the whole nail needs to be removed. Last A1c was done at [facility], might have been 6. ASA    HPI: 78 y.o. female presents today with nail problem left first toe.  She is unsure if she injured it at some point.  She states that the nail plate recurrently falls off as it grows out.  It is trimmed short today and does have some bruising underneath the nail plate.  She attributes the nail falling off around the time she started chemotherapy.  She endorses history of diabetes which she controls with diet.  Past Medical History:  Diagnosis Date   Breast cancer (HCC) 2000   left breast   Diabetes mellitus without complication (HCC)    Hypertension    Hypothyroidism    Lymphedema of left arm    Personal history of chemotherapy    Personal history of radiation therapy     Past Surgical History:  Procedure Laterality Date   APPENDECTOMY     BREAST BIOPSY Left 2002?   neg   BREAST EXCISIONAL BIOPSY Left 2000   positive, radiation and chemo   BREAST LUMPECTOMY Left 2000   COLONOSCOPY WITH PROPOFOL  N/A 10/20/2017   Procedure: COLONOSCOPY WITH PROPOFOL ;  Surgeon: Gaylyn Gladis PENNER, MD;  Location: [facility] ENDOSCOPY;  Service: Endoscopy;  Laterality: N/A;   TUBAL LIGATION      Allergies  Allergen Reactions   Avandia [Rosiglitazone]    Latex    Sulfa Antibiotics     ROS    Physical Exam: There were no vitals filed for this visit.  General: The patient is alert and oriented x3 in no acute distress.  Dermatology: Pedal skin atrophic.  Nail plates well-maintained.  Left first toenail there is stable subungual hematoma affecting nail plate which is trimmed short.  All in all this is limited to the proximal 25% of the nail bed.  Some  nail thickening present.  No tenderness on palpation.  No signs of acute infection.  Vascular: Faintly palpable DP and PT pedal pulses bilaterally. Capillary refill within normal limits.  +1 pitting edema present bilaterally.  Decreased pedal hair growth.  Neurological: Light touch sensation grossly intact bilateral feet.  Decreased protective sensation, subjective paresthesias present.  Musculoskeletal Exam: No pedal deformities noted.  Muscle strength 5/5 all major muscle groups.  Assessment/Plan of Care: 1. Type 2 diabetes mellitus with diabetic neuropathy, without long-term current use of insulin (HCC)   2. Subungual hematoma of left foot, initial encounter      No orders of the defined types were placed in this encounter.  None  Discussed clinical findings with patient today.  Discussed findings of subungual hematoma left first toenail.  Overall this is stable appearing.  We did discuss removal of the toenail, pedal pulses do seem faint and toenails not at risk of causing an infection at this point.  Advise close monitoring.  Follow-up as needed if symptoms worsen however advise allowing it to grow out and seeing if this recurs.  Patient educated on diabetes. Discussed proper diabetic foot care and discussed risks and complications of disease. Educated patient in depth on reasons to return to the  office immediately should he/she discover anything concerning or new on the feet. All questions answered. Discussed proper shoes as well.   Will reassess in 6 to 8 weeks or sooner if symptoms worsen or new problems arise.   Timmy Cleverly L. Lamount MAUL, AACFAS [facility]     [address] [place], [place] [address]                Office [phone]  Fax [phone]

## 2023-10-14 DIAGNOSIS — Z792 Long term (current) use of antibiotics: Secondary | ICD-10-CM | POA: Diagnosis not present

## 2023-10-14 DIAGNOSIS — Z79899 Other long term (current) drug therapy: Secondary | ICD-10-CM | POA: Diagnosis not present

## 2023-10-14 DIAGNOSIS — L03114 Cellulitis of left upper limb: Secondary | ICD-10-CM | POA: Diagnosis not present

## 2023-10-22 DIAGNOSIS — M65331 Trigger finger, right middle finger: Secondary | ICD-10-CM | POA: Diagnosis not present

## 2023-11-17 ENCOUNTER — Ambulatory Visit: Admitting: Podiatry

## 2023-12-01 ENCOUNTER — Ambulatory Visit: Admitting: Podiatry

## 2024-02-24 NOTE — Progress Notes (Signed)
 Carmen Hamilton                                          MRN: [id number]   02/24/2024   The [facility] Quality Team Specialist reviewed this patient medical record for the purposes of chart review for care gap closure. The following were reviewed: chart review for care gap closure-kidney health evaluation for diabetes:eGFR  and uACR.    [facility] Quality Team
